# Patient Record
Sex: Male | Born: 1975
Health system: Southern US, Community
[De-identification: ages and names within clinical notes are randomized; demographics above are authoritative.]

## PROBLEM LIST (undated history)

## (undated) HISTORY — PX: TONSILLECTOMY: SUR1361

## (undated) HISTORY — PX: ADENOIDECTOMY: SUR15

---
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
## 2016-04-29 ENCOUNTER — Ambulatory Visit: Payer: Self-pay | Admitting: Psychology

## 2016-06-02 DIAGNOSIS — R0683 Snoring: Secondary | ICD-10-CM | POA: Insufficient documentation

## 2016-06-02 DIAGNOSIS — H9313 Tinnitus, bilateral: Secondary | ICD-10-CM | POA: Insufficient documentation

## 2016-06-02 DIAGNOSIS — Z87891 Personal history of nicotine dependence: Secondary | ICD-10-CM | POA: Insufficient documentation

## 2016-06-02 DIAGNOSIS — Z789 Other specified health status: Secondary | ICD-10-CM | POA: Insufficient documentation

## 2016-06-02 DIAGNOSIS — F418 Other specified anxiety disorders: Secondary | ICD-10-CM | POA: Insufficient documentation

## 2016-06-02 DIAGNOSIS — R030 Elevated blood-pressure reading, without diagnosis of hypertension: Secondary | ICD-10-CM | POA: Insufficient documentation

## 2016-06-02 DIAGNOSIS — Z7289 Other problems related to lifestyle: Secondary | ICD-10-CM | POA: Insufficient documentation

## 2016-06-02 DIAGNOSIS — F1990 Other psychoactive substance use, unspecified, uncomplicated: Secondary | ICD-10-CM | POA: Insufficient documentation

## 2016-06-02 DIAGNOSIS — L74510 Primary focal hyperhidrosis, axilla: Secondary | ICD-10-CM | POA: Insufficient documentation

## 2016-06-02 DIAGNOSIS — N529 Male erectile dysfunction, unspecified: Secondary | ICD-10-CM | POA: Insufficient documentation

## 2016-06-25 DIAGNOSIS — J392 Other diseases of pharynx: Secondary | ICD-10-CM | POA: Insufficient documentation

## 2017-06-01 ENCOUNTER — Ambulatory Visit: Payer: 59 | Admitting: Podiatry

## 2017-06-01 ENCOUNTER — Encounter: Payer: Self-pay | Admitting: Podiatry

## 2017-06-01 ENCOUNTER — Ambulatory Visit (INDEPENDENT_AMBULATORY_CARE_PROVIDER_SITE_OTHER): Payer: 59

## 2017-06-01 VITALS — BP 125/72 | HR 72 | Resp 16

## 2017-06-01 DIAGNOSIS — M722 Plantar fascial fibromatosis: Secondary | ICD-10-CM

## 2017-06-01 DIAGNOSIS — L600 Ingrowing nail: Secondary | ICD-10-CM

## 2017-06-01 MED ORDER — NEOMYCIN-POLYMYXIN-HC 1 % OT SOLN: OTIC | 1 refills | Status: DC

## 2017-06-01 NOTE — Patient Instructions (Signed)
Betadine Soak Instructions  Purchase an 8 oz. bottle of BETADINE solution (Povidone)  THE DAY AFTER THE PROCEDURE  Place 1 tablespoon of betadine solution in a quart of warm tap water.  Submerge your foot or feet with outer bandage intact for the initial soak; this will allow the bandage to become moist and wet for easy lift off.  Once you remove your bandage, continue to soak in the solution for 20 minutes.  This soak should be done twice a day.  Next, remove your foot or feet from solution, blot dry the affected area and cover.  You may use a band aid large enough to cover the area or use gauze and tape.  Apply other medications to the area as directed by the doctor such as cortisporin otic solution (ear drops) or neosporin.  IF YOUR SKIN BECOMES IRRITATED WHILE USING THESE INSTRUCTIONS, IT IS OKAY TO SWITCH TO EPSOM SALTS AND WATER OR WHITE VINEGAR AND WATER.   Long Term Care Instructions-Post Nail Surgery  You have had your ingrown toenail and root treated with a chemical.  This chemical causes a burn that will drain and ooze like a blister.  This can drain for 6-8 weeks or longer.  It is important to keep this area clean, covered, and follow the soaking instructions dispensed at the time of your surgery.  This area will eventually dry and form a scab.  Once the scab forms you no longer need to soak or apply a dressing.  If at any time you experience an increase in pain, redness, swelling, or drainage, you should contact the office as soon as possible.    Plantar Fasciitis (Heel Spur Syndrome) with Rehab The plantar fascia is a fibrous, ligament-like, soft-tissue structure that spans the bottom of the foot. Plantar fasciitis is a condition that causes pain in the foot due to inflammation of the tissue. SYMPTOMS   Pain and tenderness on the underneath side of the foot.  Pain that worsens with standing or walking. CAUSES  Plantar fasciitis is caused by irritation and injury to the plantar  fascia on the underneath side of the foot. Common mechanisms of injury include:  Direct trauma to bottom of the foot.  Damage to a small nerve that runs under the foot where the main fascia attaches to the heel bone.  Stress placed on the plantar fascia due to bone spurs. RISK INCREASES WITH:   Activities that place stress on the plantar fascia (running, jumping, pivoting, or cutting).  Poor strength and flexibility.  Improperly fitted shoes.  Tight calf muscles.  Flat feet.  Failure to warm-up properly before activity.  Obesity. PREVENTION  Warm up and stretch properly before activity.  Allow for adequate recovery between workouts.  Maintain physical fitness:  Strength, flexibility, and endurance.  Cardiovascular fitness.  Maintain a health body weight.  Avoid stress on the plantar fascia.  Wear properly fitted shoes, including arch supports for individuals who have flat feet.  PROGNOSIS  If treated properly, then the symptoms of plantar fasciitis usually resolve without surgery. However, occasionally surgery is necessary.  RELATED COMPLICATIONS   Recurrent symptoms that may result in a chronic condition.  Problems of the lower back that are caused by compensating for the injury, such as limping.  Pain or weakness of the foot during push-off following surgery.  Chronic inflammation, scarring, and partial or complete fascia tear, occurring more often from repeated injections.  TREATMENT  Treatment initially involves the use of ice and medication to   help reduce pain and inflammation. The use of strengthening and stretching exercises may help reduce pain with activity, especially stretches of the Achilles tendon. These exercises may be performed at home or with a therapist. Your caregiver may recommend that you use heel cups of arch supports to help reduce stress on the plantar fascia. Occasionally, corticosteroid injections are given to reduce inflammation. If  symptoms persist for greater than 6 months despite non-surgical (conservative), then surgery may be recommended.   MEDICATION   If pain medication is necessary, then nonsteroidal anti-inflammatory medications, such as aspirin and ibuprofen, or other minor pain relievers, such as acetaminophen, are often recommended.  Do not take pain medication within 7 days before surgery.  Prescription pain relievers may be given if deemed necessary by your caregiver. Use only as directed and only as much as you need.  Corticosteroid injections may be given by your caregiver. These injections should be reserved for the most serious cases, because they may only be given a certain number of times.  HEAT AND COLD  Cold treatment (icing) relieves pain and reduces inflammation. Cold treatment should be applied for 10 to 15 minutes every 2 to 3 hours for inflammation and pain and immediately after any activity that aggravates your symptoms. Use ice packs or massage the area with a piece of ice (ice massage).  Heat treatment may be used prior to performing the stretching and strengthening activities prescribed by your caregiver, physical therapist, or athletic trainer. Use a heat pack or soak the injury in warm water.  SEEK IMMEDIATE MEDICAL CARE IF:  Treatment seems to offer no benefit, or the condition worsens.  Any medications produce adverse side effects.  EXERCISES- RANGE OF MOTION (ROM) AND STRETCHING EXERCISES - Plantar Fasciitis (Heel Spur Syndrome) These exercises may help you when beginning to rehabilitate your injury. Your symptoms may resolve with or without further involvement from your physician, physical therapist or athletic trainer. While completing these exercises, remember:   Restoring tissue flexibility helps normal motion to return to the joints. This allows healthier, less painful movement and activity.  An effective stretch should be held for at least 30 seconds.  A stretch should  never be painful. You should only feel a gentle lengthening or release in the stretched tissue.  RANGE OF MOTION - Toe Extension, Flexion  Sit with your right / left leg crossed over your opposite knee.  Grasp your toes and gently pull them back toward the top of your foot. You should feel a stretch on the bottom of your toes and/or foot.  Hold this stretch for 10 seconds.  Now, gently pull your toes toward the bottom of your foot. You should feel a stretch on the top of your toes and or foot.  Hold this stretch for 10 seconds. Repeat  times. Complete this stretch 3 times per day.   RANGE OF MOTION - Ankle Dorsiflexion, Active Assisted  Remove shoes and sit on a chair that is preferably not on a carpeted surface.  Place right / left foot under knee. Extend your opposite leg for support.  Keeping your heel down, slide your right / left foot back toward the chair until you feel a stretch at your ankle or calf. If you do not feel a stretch, slide your bottom forward to the edge of the chair, while still keeping your heel down.  Hold this stretch for 10 seconds. Repeat 3 times. Complete this stretch 2 times per day.   STRETCH  Gastroc, Standing    Place hands on wall.  Extend right / left leg, keeping the front knee somewhat bent.  Slightly point your toes inward on your back foot.  Keeping your right / left heel on the floor and your knee straight, shift your weight toward the wall, not allowing your back to arch.  You should feel a gentle stretch in the right / left calf. Hold this position for 10 seconds. Repeat 3 times. Complete this stretch 2 times per day.  STRETCH  Soleus, Standing  Place hands on wall.  Extend right / left leg, keeping the other knee somewhat bent.  Slightly point your toes inward on your back foot.  Keep your right / left heel on the floor, bend your back knee, and slightly shift your weight over the back leg so that you feel a gentle stretch deep in  your back calf.  Hold this position for 10 seconds. Repeat 3 times. Complete this stretch 2 times per day.  STRETCH  Gastrocsoleus, Standing  Note: This exercise can place a lot of stress on your foot and ankle. Please complete this exercise only if specifically instructed by your caregiver.   Place the ball of your right / left foot on a step, keeping your other foot firmly on the same step.  Hold on to the wall or a rail for balance.  Slowly lift your other foot, allowing your body weight to press your heel down over the edge of the step.  You should feel a stretch in your right / left calf.  Hold this position for 10 seconds.  Repeat this exercise with a slight bend in your right / left knee. Repeat 3 times. Complete this stretch 2 times per day.   STRENGTHENING EXERCISES - Plantar Fasciitis (Heel Spur Syndrome)  These exercises may help you when beginning to rehabilitate your injury. They may resolve your symptoms with or without further involvement from your physician, physical therapist or athletic trainer. While completing these exercises, remember:   Muscles can gain both the endurance and the strength needed for everyday activities through controlled exercises.  Complete these exercises as instructed by your physician, physical therapist or athletic trainer. Progress the resistance and repetitions only as guided.  STRENGTH - Towel Curls  Sit in a chair positioned on a non-carpeted surface.  Place your foot on a towel, keeping your heel on the floor.  Pull the towel toward your heel by only curling your toes. Keep your heel on the floor. Repeat 3 times. Complete this exercise 2 times per day.  STRENGTH - Ankle Inversion  Secure one end of a rubber exercise band/tubing to a fixed object (table, pole). Loop the other end around your foot just before your toes.  Place your fists between your knees. This will focus your strengthening at your ankle.  Slowly, pull your  big toe up and in, making sure the band/tubing is positioned to resist the entire motion.  Hold this position for 10 seconds.  Have your muscles resist the band/tubing as it slowly pulls your foot back to the starting position. Repeat 3 times. Complete this exercises 2 times per day.  Document Released: 02/23/2005 Document Revised: 05/18/2011 Document Reviewed: 06/07/2008 [employer] Patient Information 2014 [employer].   

## 2017-06-01 NOTE — Progress Notes (Signed)
  Subjective:  Patient ID: Johnny Tran, male    DOB: [birth year],  MRN: [id number] HPI Chief Complaint  Patient presents with  . Foot Pain    Arch bilateral - aching x 4 years, dx wtih PF by podiatrist-wears custom inserts, no AM pain, worse with activity, tried different shoes  . Toe Pain    Hallux bilateral - both nail borders, tender, soaks feet then trims out    42 y.o. male presents with the above complaint.   ROS: He denies fever chills nausea vomiting muscle aches pains calf pain shortness of breath headache.  No past medical history on file.   Current Outpatient Medications:  .  ALPRAZolam (XANAX) 0.5 MG tablet, TAKE 1/2 TO 1 TABLET BY MOUTH EVERY 6 HOURS AS NEEDED FOR SEVERE ANXIETY, Disp: , Rfl: 3 .  escitalopram (LEXAPRO) 20 MG tablet, Take 20 mg by mouth daily., Disp: , Rfl: 3 .  NEOMYCIN-POLYMYXIN-HYDROCORTISONE (CORTISPORIN) 1 % SOLN OTIC solution, Apply 1-2 drops to toe BID after soaking, Disp: 10 mL, Rfl: 1  Allergies  Allergen Reactions  . Codeine Nausea And Vomiting    Other reaction(s): Sweating (intolerance)   Review of Systems Objective:   Vitals:   06/01/17 0903  BP: 125/72  Pulse: 72  Resp: 16    General: Well developed, nourished, in no acute distress, alert and oriented x3   Dermatological: Skin is warm, dry and supple bilateral. Nails x 10 are well maintained; remaining integument appears unremarkable at this time. There are no open sores, no preulcerative lesions, no rash or signs of infection present.  Vascular: Dorsalis Pedis artery and Posterior Tibial artery pedal pulses are 2/4 bilateral with immedate capillary fill time. Pedal hair growth present. No varicosities and no lower extremity edema present bilateral.   Neruologic: Grossly intact via light touch bilateral. Vibratory intact via tuning fork bilateral. Protective threshold with Semmes Wienstein monofilament intact to all pedal sites bilateral. Patellar and Achilles deep tendon  reflexes 2+ bilateral. No Babinski or clonus noted bilateral.   Musculoskeletal: No gross boney pedal deformities bilateral. No pain, crepitus, or limitation noted with foot and ankle range of motion bilateral. Muscular strength 5/5 in all groups tested bilateral.  Gait: Unassisted, Nonantalgic.    Radiographs:  3 views of the bilateral foot taken today demonstrate no acute findings.  He does have some beaking of the talonavicular joint dorsally and a large posterior lateral process of the talus.  But no significant osseous findings other than that.  Assessment & Plan:   Assessment: Ingrown toenails tibiofibular border of the hallux bilateral.  Capsulitis fasciitis  Plan: He was scanned by Johnny Tran today for orthotics.  We also performed a chemical matrixectomy's which were performed after 3 cc of a 50-50 mixture of Marcaine plain and lidocaine plain were injected about the hallux bilaterally.  The area was prepped and draped as normal sterile fashion.  The blood was exsanguinated from each toe and a tourniquet was used.  The nail borders were split from distal to proximal and the nail fragment was avulsed exposing the matrix.  3 applications of phenol 30 seconds each were exposed to the matrix.  They were then neutralized with isopropyl alcohol Silvadene cream Telfa pad and dressed a compressive dressing was applied he is provided with oral and written home-going instructions for the care and soaking of his toe as well as a prescription for Cortisporin Otic to be applied twice daily after soaking.     Johnny Tran T. [place], [place]

## 2017-06-01 NOTE — Progress Notes (Signed)
Scanned and ordered f/o. Patient advised 398 to insurance  a

## 2017-06-17 ENCOUNTER — Ambulatory Visit: Payer: 59 | Admitting: Podiatry

## 2017-06-29 ENCOUNTER — Ambulatory Visit: Payer: 59 | Admitting: Podiatry

## 2017-06-29 DIAGNOSIS — L600 Ingrowing nail: Secondary | ICD-10-CM | POA: Diagnosis not present

## 2017-06-29 MED ORDER — CEPHALEXIN 500 MG PO CAPS: 500.0000 mg | ORAL_CAPSULE | Freq: Three times a day (TID) | ORAL | 0 refills | Status: DC

## 2017-06-30 NOTE — Progress Notes (Signed)
Subjective: 42 year old male presents the office today for follow-up evaluation status post bilateral hallux partial nail avulsions.  He states the right side is doing well on the left side he has noticed a small amount of pus coming from the area he feels is not healing as well.  He has stopped soaking in Epsom salts.  He has not been covering.  He denies any increase in pain after the pain is improved compared to what it was prior to the procedure.  No recent injury.  He also presents today to pick up orthotics. Denies any systemic complaints such as fevers, chills, nausea, vomiting. No acute changes since last appointment, and no other complaints at this time.   Objective: AAO x3, NAD DP/PT pulses palpable bilaterally, CRT less than 3 seconds On the lateral aspect of the left hallux there is a small amount of clear drainage but there is no frank purulence identified.  There is localized edema and mild erythema to the area and there is no ascending sialitis or increase in warmth.  Some of the erythema appears to be well-defined and this is over the area where it.  Her skin has peeled off and there is no extension of the redness past this area.  There is no fluctuation or crepitation or malodor. Right side appears to be healing well No open lesions or pre-ulcerative lesions.  No pain with calf compression, swelling, warmth, erythema  Assessment: Localized erythema left hallux with drainage status post partial nail avulsion  Plan: -All treatment options discussed with the patient including all alternatives, risks, complications.  -I did clean out the area in the lateral aspect left hallux toenail so that any complications.  Only small amount of clear drainage expressed.  I want him to continue with Epsom salt soaks daily as well as antibiotic ointment and a bandage.  Also will start Keflex. -Rick dispensed orthotics today. -Monitor for any clinical signs or symptoms of infection and directed to  call the office immediately should any occur or go to the ER. -Follow-up in 1 week or sooner if needed. -Patient encouraged to call the office with any questions, concerns, change in symptoms.   Trula Slade DPM

## 2017-07-27 ENCOUNTER — Other Ambulatory Visit: Payer: 59 | Admitting: Orthotics

## 2017-09-29 ENCOUNTER — Ambulatory Visit: Payer: 59 | Admitting: Podiatry

## 2017-09-29 ENCOUNTER — Encounter: Payer: Self-pay | Admitting: Podiatry

## 2017-09-29 ENCOUNTER — Ambulatory Visit: Payer: 59 | Admitting: Orthotics

## 2017-09-29 DIAGNOSIS — L600 Ingrowing nail: Secondary | ICD-10-CM | POA: Insufficient documentation

## 2017-09-29 DIAGNOSIS — M722 Plantar fascial fibromatosis: Secondary | ICD-10-CM

## 2017-09-29 MED ORDER — CEPHALEXIN 500 MG PO CAPS: 500.0000 mg | ORAL_CAPSULE | Freq: Three times a day (TID) | ORAL | 0 refills | Status: DC

## 2017-09-29 NOTE — Progress Notes (Signed)
Subjective: 42 year old male presents the office today for concerns of pain and possible ingrown toenail to the right medial toe.  He previously had a partial nail avulsion performed in March of this year.  He states he did get some pus out a couple of days ago.  The area was painful but is not as painful but is still having some swelling in the area.  He said no recent treatment otherwise. Denies any systemic complaints such as fevers, chills, nausea, vomiting. No acute changes since last appointment, and no other complaints at this time.   Objective: AAO x3, NAD DP/PT pulses palpable bilaterally, CRT less than 3 seconds Along the medial aspect of the right hallux toenail appears to be recurrence of ingrown toenail starting to come back along the proximal nail corner there is localized edema to this area.  Small amount of purulence identified from the area today however there is no ascending sialitis or increase in warmth..  No open lesions or pre-ulcerative lesions.  No pain with calf compression, swelling, warmth, erythema  Assessment: Recurrent ingrown toenail right medial hallux  Plan: -All treatment options discussed with the patient including all alternatives, risks, complications.  -At this time, the patient is requesting partial nail removal with chemical matricectomy to the symptomatic portion of the nail. Risks and complications were discussed with the patient for which they understand and written consent was obtained. Under sterile conditions a total of 3 mL of a mixture of 2% lidocaine plain and 0.5% Marcaine plain was infiltrated in a hallux block fashion. Once anesthetized, the skin was prepped in sterile fashion. A tourniquet was then applied. Next the medial aspect of hallux nail border was then sharply excised making sure to remove the entire offending nail border.  Only very minimal amount of purulence was identified today.  After the nail was removed there is no further pus or any  other signs of infection.  There is no significant erythema or ascending cellulitis identified today this appears to be localized.  Once the nails were ensured to be removed area was debrided and the underlying skin was intact. There is no purulence identified in the procedure. Next phenol was then applied under standard conditions and copiously irrigated. Silvadene was applied. A dry sterile dressing was applied. After application of the dressing the tourniquet was removed and there is found to be an immediate capillary refill time to the digit. The patient tolerated the procedure well any complications. Post procedure instructions were discussed the patient for which he verbally understood. Follow-up in 2 weeks for nail check or sooner if any problems are to arise. Discussed signs/symptoms of infection and directed to call the office immediately should any occur or go directly to the emergency room. In the meantime, encouraged to call the office with any questions, concerns, changes symptoms. -Keflex -Patient encouraged to call the office with any questions, concerns, change in symptoms.   Trula Slade DPM

## 2017-09-29 NOTE — Patient Instructions (Signed)

## 2017-10-12 DIAGNOSIS — M25562 Pain in left knee: Secondary | ICD-10-CM | POA: Insufficient documentation

## 2017-10-14 ENCOUNTER — Ambulatory Visit: Payer: Self-pay

## 2017-10-14 ENCOUNTER — Other Ambulatory Visit: Payer: 59 | Admitting: Orthotics

## 2017-10-14 DIAGNOSIS — L600 Ingrowing nail: Secondary | ICD-10-CM

## 2017-10-14 NOTE — Patient Instructions (Signed)

## 2017-10-15 NOTE — Progress Notes (Signed)
Patient presents for follow-up appointment.  Recent procedure done 09/29/2017, ingrown right toenail medial border of hallux nail was removed.  He states that his toe feels fine he is not having any pain.  He did not finish the Keflex as prescribed, he states that he only took it for 2 to 3 days.  He is also not soaking at this time.  Noted well-healing surgical sites.  No erythema, no redness, no drainage, no other signs and symptoms of infection.  Surgical site scabbed over and overall appears to be healing well.  Discussed signs and symptoms of infection and importance of reporting symptoms.  He is to follow-up with any questions or concerns or any changes in his symptoms.

## 2017-10-26 DIAGNOSIS — M2242 Chondromalacia patellae, left knee: Secondary | ICD-10-CM | POA: Insufficient documentation

## 2017-11-01 ENCOUNTER — Encounter

## 2017-11-01 ENCOUNTER — Ambulatory Visit: Payer: 59 | Admitting: Orthotics

## 2017-11-01 DIAGNOSIS — M722 Plantar fascial fibromatosis: Secondary | ICD-10-CM

## 2017-11-01 NOTE — Progress Notes (Signed)
Patient came in today to pick up custom made foot orthotics.  The goals were accomplished and the patient reported no dissatisfaction with said orthotics.  Patient was advised of breakin period and how to report any issues. 

## 2018-02-14 ENCOUNTER — Encounter: Payer: Self-pay | Admitting: Allergy

## 2018-02-14 ENCOUNTER — Ambulatory Visit: Payer: 59 | Admitting: Allergy

## 2018-02-14 VITALS — BP 104/70 | HR 72 | Temp 98.4°F | Resp 18 | Ht 67.5 in | Wt 216.4 lb

## 2018-02-14 DIAGNOSIS — T7809XD Anaphylactic reaction due to other food products, subsequent encounter: Secondary | ICD-10-CM

## 2018-02-14 DIAGNOSIS — E738 Other lactose intolerance: Secondary | ICD-10-CM | POA: Diagnosis not present

## 2018-02-14 DIAGNOSIS — J3089 Other allergic rhinitis: Secondary | ICD-10-CM

## 2018-02-14 DIAGNOSIS — T781XXD Other adverse food reactions, not elsewhere classified, subsequent encounter: Secondary | ICD-10-CM

## 2018-02-14 DIAGNOSIS — T781XXA Other adverse food reactions, not elsewhere classified, initial encounter: Secondary | ICD-10-CM | POA: Insufficient documentation

## 2018-02-14 DIAGNOSIS — E739 Lactose intolerance, unspecified: Secondary | ICD-10-CM

## 2018-02-14 MED ORDER — FLUTICASONE PROPIONATE 50 MCG/ACT NA SUSP: 2.0000 | Freq: Every day | NASAL | 5 refills | Status: AC | PRN

## 2018-02-14 NOTE — Assessment & Plan Note (Signed)
Patient concerned for food allergies particularly wheat as he noticed some headaches after drinking wheat-based beer the next day.  Otherwise he tolerates wheat-based foods and other beer products with no issues.  Today's skin testing was negative to wheat, barley, oat, rye, hops and saccharomyces.   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

## 2018-02-14 NOTE — Assessment & Plan Note (Signed)
Patient gets GI issues after consuming dairy but okay with lactose free products. He most likely has lactose intolerance. May continue to consume lactose free dairy products as before or take lactaid pills prior to consumption of dairy products.

## 2018-02-14 NOTE — Patient Instructions (Addendum)
Today's testing showed: positive to tree pollen which blooms in the spring time, few molds, cat and horse.  Avoid straight dairy products. Okay to eat lactose free products. Avoid wheat based beer.  May use over the counter antihistamines such as Zyrtec (cetirizine), Claritin (loratadine), Allegra (fexofenadine), or Xyzal (levocetirizine) daily as needed.  Start Flonase 2 sprays daily as needed for nasal congestion.  Monitor symptoms.  Follow up in 4 months  Reducing Pollen Exposure . Pollen seasons: trees (spring), grass (summer) and ragweed/weeds (fall). Marland Kitchen Keep windows closed in your home and car to lower pollen exposure.  Susa Simmonds air conditioning in the bedroom and throughout the house if possible.  . Avoid going out in dry windy days - especially early morning. . Pollen counts are highest between 5 - 10 AM and on dry, hot and windy days.  . Save outside activities for late afternoon or after a heavy rain, when pollen levels are lower.  . Avoid mowing of grass if you have grass pollen allergy. Marland Kitchen Be aware that pollen can also be transported indoors on people and pets.  . Dry your clothes in an automatic dryer rather than hanging them outside where they might collect pollen.  . Rinse hair and eyes before bedtime.  Pet Allergen Avoidance: . Contrary to popular opinion, there are no "hypoallergenic" breeds of dogs or cats. That is because people are not allergic to an animal's hair, but to an allergen found in the animal's saliva, dander (dead skin flakes) or urine. Pet allergy symptoms typically occur within minutes. For some people, symptoms can build up and become most severe 8 to 12 hours after contact with the animal. People with severe allergies can experience reactions in public places if dander has been transported on the pet owners' clothing. Marland Kitchen Keeping an animal outdoors is only a partial solution, since homes with pets in the yard still have higher concentrations of animal  allergens. . Before getting a pet, ask your allergist to determine if you are allergic to animals. If your pet is already considered part of your family, try to minimize contact and keep the pet out of the bedroom and other rooms where you spend a great deal of time. . As with dust mites, vacuum carpets often or replace carpet with a hardwood floor, tile or linoleum. . High-efficiency particulate air (HEPA) cleaners can reduce allergen levels over time. . While dander and saliva are the source of cat and dog allergens, urine is the source of allergens from rabbits, hamsters, mice and [place] pigs; so ask a non-allergic family member to clean the animal's cage. . If you have a pet allergy, talk to your allergist about the potential for allergy immunotherapy (allergy shots). This strategy can often provide long-term relief.  Mold Control . Mold and fungi can grow on a variety of surfaces provided certain temperature and moisture conditions exist.  . Outdoor molds grow on plants, decaying vegetation and soil. The major outdoor mold, Alternaria and Cladosporium, are found in very high numbers during hot and dry conditions. Generally, a late summer - fall peak is seen for common outdoor fungal spores. Rain will temporarily lower outdoor mold spore count, but counts rise rapidly when the rainy period ends. . The most important indoor molds are Aspergillus and Penicillium. Dark, humid and poorly ventilated basements are ideal sites for mold growth. The next most common sites of mold growth are the bathroom and the kitchen. Outdoor (Seasonal) Mold Control . Use air conditioning and  keep windows closed. . Avoid exposure to decaying vegetation. Marland Kitchen Avoid leaf raking. . Avoid grain handling. . Consider wearing a face mask if working in moldy areas.  Indoor (Perennial) Mold Control  . Maintain humidity below 50%. . Get rid of mold growth on hard surfaces with water, detergent and, if necessary, 5% bleach (do not  mix with other cleaners). Then dry the area completely. If mold covers an area more than 10 square feet, consider hiring an indoor environmental professional. . For clothing, washing with soap and water is best. If moldy items cannot be cleaned and dried, throw them away. . Remove sources e.g. contaminated carpets. . Repair and seal leaking roofs or pipes. Using dehumidifiers in damp basements may be helpful, but empty the water and clean units regularly to prevent mildew from forming. All rooms, especially basements, bathrooms and kitchens, require ventilation and cleaning to deter mold and mildew growth. Avoid carpeting on concrete or damp floors, and storing items in damp areas.

## 2018-02-14 NOTE — Assessment & Plan Note (Signed)
Rhinitis symptoms for the past 2 to 3 years mainly during the spring.  Used Claritin, Zyrtec and Zaditor as needed with good benefit.  No previous work-up.  Today's skin testing was positive to trees, mold, cat and horse.  Discussed environmental control measures.  May use over the counter antihistamines such as Zyrtec (cetirizine), Claritin (loratadine), Allegra (fexofenadine), or Xyzal (levocetirizine) daily as needed.  Start Flonase 2 sprays daily as needed for nasal congestion during the spring.

## 2018-02-14 NOTE — Progress Notes (Signed)
New Patient Note  RE: Johnny Tran MRN: [id number] DOB: August 15, 1975 Date of Office Visit: 02/14/2018  Referring provider: Milford Cage, PA Primary care provider: Milford Cage, PA  Chief Complaint: Allergy Testing and Nasal Congestion  History of Present Illness: I had the pleasure of seeing Johnny Tran for initial evaluation at the [facility] on 02/14/2018. He is a 42 y.o. male, who is self-referred here for the evaluation of food allergies and nasal congestion.   Food allergies: Patient gets headaches after drinking wheat based beer the next day. No issues with wheat based product or other beer products. Patient is lactose intolerant and takes lactaid pills with good benefit.  Past work up includes: none Dietary History: patient has been eating other foods including limited milk, eggs, peanut, treenuts, sesame, shellfish, seafood, soy, wheat, meats, fruits and vegetables.  He reports reading labels and avoiding straight diary and wheat based beer in diet completely.  Rhinitis: He reports symptoms of sneezing, nasal congestion. Symptoms have been going on for 2-3 years. The symptoms are present spring. Anosmia: no. Headache: yes. He has used Zaditor prn, Claritin, zyrtec with fair improvement in symptoms. Sinus infections: no. Previous work up includes: no. Previous ENT evaluation: yes for hearing loss.   Rash: Patient went camping last year and broke out in a rash in the buttocks area which was diagnosed with poison ivy. Resolved after 2 rounds of steroid injections and pills.   Assessment and Plan: Rhythm is a 42 y.o. male with: Adverse food reaction Patient concerned for food allergies particularly wheat as he noticed some headaches after drinking wheat-based beer the next day.  Otherwise he tolerates wheat-based foods and other beer products with no issues.  Today's skin testing was negative to wheat, barley, oat, rye, hops and saccharomyces.    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  Lactose intolerance Patient gets GI issues after consuming dairy but okay with lactose free products. He most likely has lactose intolerance. May continue to consume lactose free dairy products as before or take lactaid pills prior to consumption of dairy products.   Other allergic rhinitis Rhinitis symptoms for the past 2 to 3 years mainly during the spring.  Used Claritin, Zyrtec and Zaditor as needed with good benefit.  No previous work-up.  Today's skin testing was positive to trees, mold, cat and horse.  Discussed environmental control measures.  May use over the counter antihistamines such as Zyrtec (cetirizine), Claritin (loratadine), Allegra (fexofenadine), or Xyzal (levocetirizine) daily as needed.  Start Flonase 2 sprays daily as needed for nasal congestion during the spring.   Return in about 4 months (around 06/16/2018).  Meds ordered this encounter  Medications  . fluticasone (FLONASE) 50 MCG/ACT nasal spray    Sig: Place 2 sprays into both nostrils daily as needed for allergies or rhinitis.    Dispense:  16 g    Refill:  5   Lab Orders  No laboratory test(s) ordered today    Other allergy screening: Asthma: no Rhino conjunctivitis: yes Medication allergy: yes  Codeine - nausea Hymenoptera allergy: no Urticaria: no Eczema:no History of recurrent infections suggestive of immunodeficency: no  Diagnostics: Skin Testing: enviromental and select foods.. Positive test to: trees, mold, cat, horse. Negative test to: foods.  Results discussed with patient/family. Airborne Adult Perc - 02/14/18 0948    Time Antigen Placed  [phone]  Allergen Manufacturer  [place]    Location  Back    Number of Test  59    Panel 1  Select    1. Control-Buffer 50% Glycerol  Negative    2. Control-Histamine 1 mg/ml  2+    3. Albumin saline  Negative     4. [place]  Negative    5. [place]  Negative    6. [place]  Negative    7. [place] Blue  Negative    8. Meadow Fescue  Negative    9. Perennial Rye  Negative    10. Sweet Vernal  Negative    11. Timothy  Negative    12. Cocklebur  Negative    13. Burweed [place]er  Negative    14. Ragweed, short  Negative    15. Ragweed, Giant  Negative    16. Plantain,  English  Negative    17. Lamb's Quarters  Negative    18. Sheep Sorrell  Negative    19. Rough Pigweed  Negative    20. Marsh Elder, Rough  Negative    21. Mugwort, Common  Negative    22. Ash mix  Negative    23. Birch mix  Negative    24. Beech American  Negative    25. Box, Elder  Negative    26. Cedar, red  Negative    27. Cottonwood, [place]  Negative    28. Elm mix  Negative    29. Hickory mix  Negative    30. Maple mix  4+    31. Oak, [place] mix  Negative    32. Pecan Pollen  4+    33. Pine mix  Negative    34. Sycamore Eastern  Negative    35. [place], Black Pollen  Negative    36. Alternaria alternata  Negative    37. Cladosporium Herbarum  Negative    38. Aspergillus mix  Negative    39. Penicillium mix  Negative    40. Bipolaris sorokiniana (Helminthosporium)  Negative    41. Drechslera spicifera (Curvularia)  Negative    42. Mucor plumbeus  Negative    43. Fusarium moniliforme  Negative    44. Aureobasidium pullulans (pullulara)  Negative    45. Rhizopus oryzae  Negative    46. Botrytis cinera  Negative    47. Epicoccum nigrum  Negative    48. Phoma betae  Negative    49. Candida Albicans  Negative    50. Trichophyton mentagrophytes  Negative    51. Mite, D Farinae  5,000 AU/ml  Negative    52. Mite, D Pteronyssinus  5,000 AU/ml  Negative    53. Cat Hair 10,000 BAU/ml  Negative    54.  Dog Epithelia  Negative    55. Mixed Feathers  Negative    56. Horse Epithelia  3+    57. Cockroach, German  Negative    58. Mouse  Negative    59. Tobacco Leaf  Negative     Intradermal - 02/14/18 1015    Time  Antigen Placed  1024    Allergen Manufacturer  Lavella Hammock    Location  Arm    Number of Test  14    Intradermal  Select    Control  Negative    [place]  Negative    Johnson  Negative    7 Grass  Negative    Ragweed mix  Negative    Weed mix  Negative    Mold 1  Negative    Mold 2  Negative    Mold 3  Negative    Mold 4  3+    Cat  3+    Dog  Negative    Cockroach  Negative    Mite mix  Negative     Food Adult Perc - 02/14/18 0948    Time Antigen Placed  6712    Allergen Manufacturer  Lavella Hammock    Location  Back    Number of allergen test  8     Control-buffer 50% Glycerol  Negative    Control-Histamine 1 mg/ml  2+    3. Wheat  Negative    30. Barley  Negative    31. Oat   Negative    32. Rye   Negative    33. Hops  Negative    36. Saccharomyces Cerevisiae   Negative       Past Medical History: Patient Active Problem List   Diagnosis Date Noted  . Adverse food reaction 02/14/2018  . Other allergic rhinitis 02/14/2018  . Lactose intolerance 02/14/2018  . Ingrown toenail, hallux, bilateral  09/29/2017   History reviewed. No pertinent past medical history. Past Surgical History: Past Surgical History:  Procedure Laterality Date  . ADENOIDECTOMY    . TONSILLECTOMY    . WISDOM TOOTH EXTRACTION Bilateral 1992   Medication List:  Current Outpatient Medications  Medication Sig Dispense Refill  . ALPRAZolam (XANAX) 0.5 MG tablet TAKE 1/2 TO 1 TABLET BY MOUTH EVERY 6 HOURS AS NEEDED FOR SEVERE ANXIETY  3  . escitalopram (LEXAPRO) 20 MG tablet Take 20 mg by mouth daily.  3  . Loperamide-Simethicone 2-125 MG TABS Take 1 tablet by mouth.    . NEOMYCIN-POLYMYXIN-HYDROCORTISONE (CORTISPORIN) 1 % SOLN OTIC solution Apply 1-2 drops to toe BID after soaking 10 mL 1  . Omega-3 Fatty Acids (FISH OIL) 1000 MG CAPS Take 1 capsule by mouth.    . fluticasone (FLONASE) 50 MCG/ACT nasal spray Place 2 sprays into both nostrils daily as needed for allergies or rhinitis. 16 g 5   No current  facility-administered medications for this visit.    Allergies: Allergies  Allergen Reactions  . Codeine Nausea And Vomiting    Other reaction(s): Sweating (intolerance)   Social History: Social History   Socioeconomic History  . Marital status: Unknown    Spouse name: Not on file  . Number of children: Not on file  . Years of education: Not on file  . Highest education level: Not on file  Occupational History  . Not on file  Social Needs  . Financial resource strain: Not on file  . Food insecurity:    Worry: Not on file    Inability: Not on file  . Transportation needs:    Medical: Not on file    Non-medical: Not on file  Tobacco Use  . Smoking status: Former Smoker    Last attempt to quit: 2015    Years since quitting: 4.9  . Smokeless tobacco: Never Used  Substance and Sexual Activity  . Alcohol use: Yes  . Drug use: Yes    Types: Marijuana  . Sexual activity: Not on file  Lifestyle  . Physical activity:    Days per week: Not on file    Minutes per session: Not on file  . Stress: Not on file  Relationships  . Social connections:    Talks on phone: Not on file    Gets together: Not on file  Attends religious service: Not on file    Active member of club or organization: Not on file    Attends meetings of clubs or organizations: Not on file    Relationship status: Not on file  Other Topics Concern  . Not on file  Social History Narrative  . Not on file   Lives in a 44 year old home. Smoking: 1/2pppd x 24 years Occupation: Brewing technologist History: Water Damage/mildew in the house: no Carpet in the family room: no Carpet in the bedroom: no Heating: gas Cooling: central Pet: yes dog x 4 yrs, sugar gliders x 2 yrs  Family History: History reviewed. No pertinent family history. Problem                               Relation Asthma                                   No  Eczema                                No  Food allergy                           No  Allergic rhino conjunctivitis     No   Review of Systems  Constitutional: Negative for appetite change, chills, fever and unexpected weight change.  HENT: Negative for congestion and rhinorrhea.   Eyes: Negative for itching.  Respiratory: Negative for cough, chest tightness, shortness of breath and wheezing.   Cardiovascular: Negative for chest pain.  Gastrointestinal: Negative for abdominal pain.  Genitourinary: Negative for difficulty urinating.  Skin: Negative for rash.  Allergic/Immunologic: Positive for environmental allergies. Negative for food allergies.  Neurological: Negative for headaches.   Objective: BP 104/70 (BP Location: Left Arm, Patient Position: Sitting, Cuff Size: Large)   Pulse 72   Temp 98.4 F (36.9 C) (Oral)   Resp 18   Ht 5' 7.5" (1.715 m)   Wt 216 lb 6.4 oz (98.2 kg)   SpO2 96%   BMI 33.39 kg/m  Body mass index is 33.39 kg/m. Physical Exam  Constitutional: He is oriented to person, place, and time. He appears well-developed and well-nourished.  HENT:  Head: Normocephalic and atraumatic.  Right Ear: External ear normal.  Left Ear: External ear normal.  Nose: Nose normal.  Mouth/Throat: Oropharynx is clear and moist.  Eyes: Conjunctivae and EOM are normal.  Neck: Neck supple.  Cardiovascular: Normal rate, regular rhythm and normal heart sounds. Exam reveals no gallop and no friction rub.  No murmur heard. Pulmonary/Chest: Effort normal and breath sounds normal. He has no wheezes. He has no rales.  Abdominal: Soft. Bowel sounds are normal. There is no tenderness.  Lymphadenopathy:    He has no cervical adenopathy.  Neurological: He is alert and oriented to person, place, and time.  Skin: Skin is warm. No rash noted.  Psychiatric: He has a normal mood and affect. His behavior is normal.  Nursing note and vitals reviewed.  The plan was reviewed with the patient/family, and all questions/concerned were addressed.  It was my pleasure  to see Johnny Tran today and participate in his care. Please feel free to contact me with any questions or concerns.  Sincerely,  Scherrie Bateman  Maudie Mercury, DO Allergy & Immunology  [facility] office: [phone] [place]

## 2018-02-22 NOTE — Progress Notes (Signed)
Adjusted f/o to hiis satisfaction.

## 2019-05-08 ENCOUNTER — Other Ambulatory Visit: Payer: Self-pay

## 2019-05-08 ENCOUNTER — Ambulatory Visit: Payer: 59 | Attending: Internal Medicine

## 2019-05-08 DIAGNOSIS — Z23 Encounter for immunization: Secondary | ICD-10-CM

## 2019-05-08 NOTE — Progress Notes (Signed)
   Covid-19 Vaccination Clinic  Name:  Johnny Tran    MRN: [id number] DOB: Aug 31, 1975  05/08/2019  Mr. [name] was observed post Covid-19 immunization for 15 minutes without incidence. He was provided with Vaccine Information Sheet and instruction to access the V-Safe system.   Mr. [name] was instructed to call 911 with any severe reactions post vaccine: Marland Kitchen Difficulty breathing  . Swelling of your face and throat  . A fast heartbeat  . A bad rash all over your body  . Dizziness and weakness    Immunizations Administered    Name Date Dose VIS Date Route   Pfizer COVID-19 Vaccine 05/08/2019  3:01 PM 0.3 mL 02/17/2019 Intramuscular   Manufacturer: [place]   Lot: [id number]   [place]: [id number]

## 2019-05-31 ENCOUNTER — Ambulatory Visit: Payer: Self-pay | Attending: Internal Medicine

## 2019-05-31 DIAGNOSIS — Z23 Encounter for immunization: Secondary | ICD-10-CM

## 2019-05-31 NOTE — Progress Notes (Signed)
   Covid-19 Vaccination Clinic  Name:  Johnny Tran    MRN: [id number] DOB: [birth year]  05/31/2019  [name] was observed post Covid-19 immunization for 15 minutes without incident. He was provided with Vaccine Information Sheet and instruction to access the V-Safe system.   [name] was instructed to call 911 with any severe reactions post vaccine: Marland Kitchen Difficulty breathing  . Swelling of face and throat  . A fast heartbeat  . A bad rash all over body  . Dizziness and weakness   Immunizations Administered    Name Date Dose VIS Date Route   Pfizer COVID-19 Vaccine 05/31/2019 11:22 AM 0.3 mL 02/17/2019 Intramuscular   Manufacturer: [place]   Lot: [id number]   [place]: [id number]

## 2019-09-18 DIAGNOSIS — H903 Sensorineural hearing loss, bilateral: Secondary | ICD-10-CM | POA: Insufficient documentation

## 2019-10-02 ENCOUNTER — Ambulatory Visit (INDEPENDENT_AMBULATORY_CARE_PROVIDER_SITE_OTHER): Payer: 59

## 2019-10-02 ENCOUNTER — Ambulatory Visit: Payer: 59 | Admitting: Podiatry

## 2019-10-02 ENCOUNTER — Other Ambulatory Visit: Payer: Self-pay

## 2019-10-02 DIAGNOSIS — M205X1 Other deformities of toe(s) (acquired), right foot: Secondary | ICD-10-CM | POA: Diagnosis not present

## 2019-10-02 DIAGNOSIS — M779 Enthesopathy, unspecified: Secondary | ICD-10-CM | POA: Diagnosis not present

## 2019-10-02 DIAGNOSIS — M2061 Acquired deformities of toe(s), unspecified, right foot: Secondary | ICD-10-CM

## 2019-10-08 NOTE — Progress Notes (Signed)
Subjective: 44 year old male presents the office with concerns of pain to the right foot he points on the bunion area.  This is on the last 2 months.  He has no issues on the left side.  He will occasionally get sharp pain in the right foot on this area.  No recent injury or falls.  No other treatment. Denies any systemic complaints such as fevers, chills, nausea, vomiting. No acute changes since last appointment, and no other complaints at this time.   Objective: AAO x3, NAD DP/PT pulses palpable bilaterally, CRT less than 3 seconds Tenderness on the first MPJ on the right foot and there is decreased range of motion on the first MPJ.  There is minimal edema there is no erythema warmth.  There is no other area discomfort.  No pain in the toenails. No pain with calf compression, swelling, warmth, erythema  Assessment: Hallux limitus, capsulitis right foot  Plan: -All treatment options discussed with the patient including all alternatives, risks, complications.  -X-ray today reviewed.  Mild arthritic changes present within the first MPJ.  No evidence of acute fracture. -Discussed steroid injection -Discussed shoe modifications and orthotics.  He is going to follow up with [facility] for orthotics -Patient encouraged to call the office with any questions, concerns, change in symptoms.   Trula Slade DPM

## 2019-10-12 ENCOUNTER — Other Ambulatory Visit: Payer: Self-pay

## 2019-10-12 ENCOUNTER — Ambulatory Visit (INDEPENDENT_AMBULATORY_CARE_PROVIDER_SITE_OTHER): Payer: 59 | Admitting: Orthotics

## 2019-10-12 DIAGNOSIS — M2061 Acquired deformities of toe(s), unspecified, right foot: Secondary | ICD-10-CM

## 2019-10-12 DIAGNOSIS — M205X1 Other deformities of toe(s) (acquired), right foot: Secondary | ICD-10-CM

## 2019-10-12 DIAGNOSIS — M216X2 Other acquired deformities of left foot: Secondary | ICD-10-CM

## 2019-10-12 NOTE — Progress Notes (Signed)
Repeat 2019 order

## 2019-11-01 ENCOUNTER — Telehealth: Payer: Self-pay | Admitting: Podiatry

## 2019-11-01 NOTE — Telephone Encounter (Signed)
Please take care of this.  

## 2019-11-01 NOTE — Telephone Encounter (Signed)
I called pt to tell him orthotics are ok to pick up 2 pr orthotics(1 new/ 1 Refurbished)  He then stated that an anti inflammatory medication was to be called in to the pharmacy( [facility]) and they have not received it.Marland Kitchen

## 2019-11-01 NOTE — Telephone Encounter (Addendum)
This was sent to Dr. Milinda Pointer - I was going to go ahead and send in Rx to correct pharmacy, however, I do not see what Rx needs to be sent.   Refer to Dr. Jacqualyn Posey since this is his patient.

## 2019-11-02 ENCOUNTER — Other Ambulatory Visit: Payer: Self-pay | Admitting: Podiatry

## 2019-11-02 MED ORDER — MELOXICAM 15 MG PO TABS: 15.0000 mg | ORAL_TABLET | Freq: Every day | ORAL | 0 refills | Status: AC

## 2019-11-02 NOTE — Telephone Encounter (Signed)
Sent mobic 

## 2019-12-12 ENCOUNTER — Ambulatory Visit: Payer: Self-pay | Attending: Internal Medicine

## 2019-12-12 DIAGNOSIS — Z23 Encounter for immunization: Secondary | ICD-10-CM

## 2019-12-12 NOTE — Progress Notes (Signed)
   Covid-19 Vaccination Clinic  Name:  Johnny Tran    MRN: [id number] DOB: [birth year]  12/12/2019  Mr. Johnny Tran was observed post Covid-19 immunization for 15 minutes without incident. He was provided with Vaccine Information Sheet and instruction to access the V-Safe system.   Mr. Johnny Tran was instructed to call 911 with any severe reactions post vaccine: Marland Kitchen Difficulty breathing  . Swelling of face and throat  . A fast heartbeat  . A bad rash all over body  . Dizziness and weakness

## 2020-05-20 ENCOUNTER — Ambulatory Visit: Payer: 59 | Admitting: Podiatry

## 2020-05-20 ENCOUNTER — Other Ambulatory Visit: Payer: Self-pay

## 2020-05-20 DIAGNOSIS — M205X1 Other deformities of toe(s) (acquired), right foot: Secondary | ICD-10-CM

## 2020-05-20 DIAGNOSIS — M779 Enthesopathy, unspecified: Secondary | ICD-10-CM

## 2020-05-20 DIAGNOSIS — M2061 Acquired deformities of toe(s), unspecified, right foot: Secondary | ICD-10-CM

## 2020-05-20 MED ORDER — MELOXICAM 15 MG PO TABS: 15.0000 mg | ORAL_TABLET | Freq: Every day | ORAL | 0 refills | Status: DC

## 2020-05-20 MED ORDER — TRIAMCINOLONE ACETONIDE 10 MG/ML IJ SUSP
10.0000 mg | Freq: Once | INTRAMUSCULAR | Status: AC
  Administered 2020-05-22: 10 mg

## 2020-05-20 NOTE — Patient Instructions (Signed)
We were talking about what is called a "1st MTPJ arthrodesis" I typically use hardware from a company called [employer] where we put a plate and screws/staple across the joint in order to fuse the joint.

## 2020-05-22 DIAGNOSIS — M779 Enthesopathy, unspecified: Secondary | ICD-10-CM | POA: Diagnosis not present

## 2020-05-22 NOTE — Progress Notes (Signed)
Subjective: 45 year old male presents the office today for injection in his right foot on his big toe.  He states the pain started to hurt again he is going to [employer] and want to have an injection.  He also wants to discuss other options including surgery.  No recent or falls or changes otherwise. Denies any systemic complaints such as fevers, chills, nausea, vomiting. No acute changes since last appointment, and no other complaints at this time.   Objective: AAO x3, NAD DP/PT pulses palpable bilaterally, CRT less than 3 seconds There is tenderness in the dorsal aspect of the right first MPJ there is mild discomfort pressure measured with mild crepitation.  There is no erythema or warmth.  No other areas of discomfort identified.  No pain with calf compression, swelling, warmth, erythema  Assessment: Right foot capsulitis, hallux limitus  Plan: -All treatment options discussed with the patient including all alternatives, risks, complications.  -Steroid injection was performed.  See procedure note below. -We also surgical invention including first MPJ arthrodesis. -We will try to get him a graphite insert prior to his trip.  -Patient encouraged to call the office with any questions, concerns, change in symptoms.   Procedure: Injection small joint Discussed alternatives, risks, complications and verbal consent was obtained.  Location: Right 1st MTPJ Skin Prep: Betadine. Injectate: 0.5cc 0.5% marcaine plain, 0.5 cc 2% lidocaine plain and, 1 cc kenalog 10. Disposition: Patient tolerated procedure well. Injection site dressed with a band-aid.  Post-injection care was discussed and return precautions discussed.   Return if symptoms worsen or fail to improve.  Trula Slade DPM

## 2020-05-23 ENCOUNTER — Telehealth: Payer: Self-pay | Admitting: *Deleted

## 2020-05-23 NOTE — Telephone Encounter (Signed)
Called and left a message for the patient that I mailed a carbon insert per Dr Jacqualyn Posey. Johnny Tran

## 2020-06-10 ENCOUNTER — Other Ambulatory Visit: Payer: Self-pay | Admitting: Podiatry

## 2020-06-10 NOTE — Telephone Encounter (Signed)
Please advise 

## 2020-07-08 ENCOUNTER — Telehealth: Payer: Self-pay | Admitting: Podiatry

## 2020-07-08 NOTE — Telephone Encounter (Signed)
Pt left message asking about warranty for the orthotics. He stated they are coming apart. He got them refurbished last august(2021).   I talked with [facility] @ [place] and she said it was a little over but to send them back and they would take care of it for [place]..  I called pt and informed him of this and he said he will probably drop them off tomorrow. I told him to tell the person he gives them to that they are under warranty so no charge.

## 2020-07-08 NOTE — Telephone Encounter (Signed)
Thanks

## 2020-07-16 ENCOUNTER — Other Ambulatory Visit: Payer: Self-pay | Admitting: Podiatry

## 2020-07-16 NOTE — Telephone Encounter (Signed)
Please advise 

## 2020-07-17 ENCOUNTER — Telehealth: Payer: Self-pay | Admitting: Podiatry

## 2020-07-17 NOTE — Telephone Encounter (Signed)
Received message from lab about pts orthotics that were sent back under warranty. They were wanting to know what top cover we wanted.  I contacted pt and asked him if we could change the top cover because I believe that when they start to separate like his it is caused by excess sweat. He said it was fine to change the top cover as long as it was not a thick top cover because that takes up too much room in shoes and is not comfortable. He stated his feet do sweat alot  I then called [facility] @ [place] and told her ok to change to something that would with hold sweat better and not separate but it cannot be very thick. She is going to talk with Cristie Hem and they will decide what is going to work best for pt.

## 2020-07-22 ENCOUNTER — Telehealth: Payer: Self-pay | Admitting: Podiatry

## 2020-07-22 NOTE — Telephone Encounter (Signed)
Orthotics in (Designer, television/film set @ n/c) ... lvm for pt ok to pick them up.Marland Kitchen

## 2020-08-14 ENCOUNTER — Other Ambulatory Visit: Payer: Self-pay | Admitting: Podiatry

## 2020-08-14 NOTE — Telephone Encounter (Signed)
Please advise 

## 2020-09-10 ENCOUNTER — Ambulatory Visit (INDEPENDENT_AMBULATORY_CARE_PROVIDER_SITE_OTHER): Payer: 59

## 2020-09-10 ENCOUNTER — Encounter: Payer: Self-pay | Admitting: Podiatry

## 2020-09-10 ENCOUNTER — Ambulatory Visit: Payer: 59 | Admitting: Podiatry

## 2020-09-10 ENCOUNTER — Other Ambulatory Visit: Payer: Self-pay

## 2020-09-10 DIAGNOSIS — M205X1 Other deformities of toe(s) (acquired), right foot: Secondary | ICD-10-CM

## 2020-09-10 DIAGNOSIS — M7751 Other enthesopathy of right foot: Secondary | ICD-10-CM | POA: Diagnosis not present

## 2020-09-10 DIAGNOSIS — G4700 Insomnia, unspecified: Secondary | ICD-10-CM | POA: Insufficient documentation

## 2020-09-10 DIAGNOSIS — G4733 Obstructive sleep apnea (adult) (pediatric): Secondary | ICD-10-CM | POA: Insufficient documentation

## 2020-09-10 DIAGNOSIS — Z9989 Dependence on other enabling machines and devices: Secondary | ICD-10-CM | POA: Insufficient documentation

## 2020-09-10 NOTE — Patient Instructions (Signed)
The company that I will use the hardware for most likely is called "[employer]" and it is a MTPJ fusion.

## 2020-09-16 ENCOUNTER — Other Ambulatory Visit: Payer: Self-pay | Admitting: Podiatry

## 2020-09-16 NOTE — Progress Notes (Signed)
Subjective: 45 year old male presents the office today for injection in his right foot on his big toe.  Also discussed surgical options at the pain has been increasing.  He is electing surgery later in the year.  Denies any recent injury or changes otherwise. Denies any systemic complaints such as fevers, chills, nausea, vomiting. No acute changes since last appointment, and no other complaints at this time.   Objective: AAO x3, NAD DP/PT pulses palpable bilaterally, CRT less than 3 seconds There is tenderness in the dorsal aspect of the right first MPJ there is mild discomfort pressure measured with mild crepitation.  There is no erythema or warmth.  No other areas of discomfort identified.  No pain with calf compression, swelling, warmth, erythema  Assessment: Right foot capsulitis, hallux limitus  Plan: -All treatment options discussed with the patient including all alternatives, risks, complications.  -X-rays obtained reviewed.  Mild arthritic changes present first MPJ. -Steroid injection was performed.  See procedure note below. -We also surgical invention including first MPJ arthrodesis versus cheilectomy.  Discussed the surgery plus postoperative course.  He will consider his options. -Patient encouraged to call the office with any questions, concerns, change in symptoms.   Procedure: Injection small joint Discussed alternatives, risks, complications and verbal consent was obtained.  Location: Right 1st MTPJ Skin Prep: Betadine. Injectate: 0.5cc 0.5% marcaine plain, 0.5 cc 2% lidocaine plain and, 1 cc kenalog 10. Disposition: Patient tolerated procedure well. Injection site dressed with a band-aid.  Post-injection care was discussed and return precautions discussed.   Return if symptoms worsen or fail to improve.  Trula Slade DPM

## 2020-09-23 ENCOUNTER — Ambulatory Visit: Payer: 59 | Admitting: Podiatry

## 2020-10-28 DIAGNOSIS — T161XXA Foreign body in right ear, initial encounter: Secondary | ICD-10-CM | POA: Insufficient documentation

## 2020-10-30 ENCOUNTER — Other Ambulatory Visit: Payer: Self-pay | Admitting: Internal Medicine

## 2020-10-30 DIAGNOSIS — Z122 Encounter for screening for malignant neoplasm of respiratory organs: Secondary | ICD-10-CM

## 2020-11-25 ENCOUNTER — Other Ambulatory Visit: Payer: Self-pay

## 2020-11-25 ENCOUNTER — Ambulatory Visit
Admission: RE | Admit: 2020-11-25 | Discharge: 2020-11-25 | Disposition: A | Discharge status: Home / Self Care | Payer: 59 | Source: Ambulatory Visit | Attending: Internal Medicine | Admitting: Internal Medicine

## 2020-11-25 DIAGNOSIS — Z122 Encounter for screening for malignant neoplasm of respiratory organs: Secondary | ICD-10-CM

## 2020-11-29 ENCOUNTER — Encounter: Payer: Self-pay | Admitting: Podiatrist

## 2020-11-29 ENCOUNTER — Ambulatory Visit (INDEPENDENT_AMBULATORY_CARE_PROVIDER_SITE_OTHER): Payer: 59

## 2020-11-29 ENCOUNTER — Other Ambulatory Visit: Payer: Self-pay

## 2020-11-29 ENCOUNTER — Ambulatory Visit: Payer: 59 | Admitting: Podiatrist

## 2020-11-29 DIAGNOSIS — M7751 Other enthesopathy of right foot: Secondary | ICD-10-CM | POA: Diagnosis not present

## 2020-11-29 MED ORDER — DEXAMETHASONE SODIUM PHOSPHATE 120 MG/30ML IJ SOLN
4.0000 mg | Freq: Once | INTRAMUSCULAR | Status: AC
  Administered 2020-11-29: 4 mg via INTRA_ARTICULAR

## 2020-11-29 NOTE — Progress Notes (Signed)
Chief Complaint  Patient presents with   Foot Pain    Right foot pain  Pain is localized under the first 3 toes at the ball of the foot  Pt stated that it feels like a bubble is in his foot      HPI: Patient is 45 y.o. male who presents today for pain in the right foot localized under the toes 2 3 and 4 of the right foot.  He states that it feels inflamed with the first up in the morning and throughout the day it gets even more inflamed.  He states it feels like there is a fullness present or like there is a bubble on the bottom of his foot.  He denies any trauma or injury to the foot.  He knows he has arthritis on the first metatarsophalangeal joint of the same foot and is considering surgery with Dr. Jacqualyn Posey in the future.   Allergies  Allergen Reactions   Codeine Nausea And Vomiting    Other reaction(s): Sweating (intolerance)    Review of systems is reviewed and negative.   Physical Exam  Patient is awake, alert, and oriented x 3.  In no acute distress.    Vascular status is intact with palpable pedal pulses DP and PT bilateral and capillary refill time less than 3 seconds bilateral.  No edema or erythema noted.   Neurological exam reveals epicritic and protective sensation grossly intact bilateral.   Dermatological exam reveals skin is supple and dry to bilateral feet.  No open lesions present.    Musculoskeletal exam: Hallux rigidus/limitus is present on the right first metatarsal phalangeal joint with decrease in range of motion noted and inflammation around the first metatarsal phalangeal joint seen. Pain on palpation plantar aspect submetatarsal 2 and 3 of the right foot is identified.  No palpable click noted concerning for neuroma.  X-rays 3 views of the right foot are obtained.  No sign of acute osseous abnormalities are noted no sign of stress fracture or stress reaction noted.  Spurring at the first metatarsal phalangeal joint with decrease in joint space and flattening  of the first metatarsal head is noted on x-ray which is unchanged from the previous films.  Assessment: 1. Capsulitis of toe, right      Plan: Discussed treatment options and alternatives with the patient.    Discussed we could try a steroid injection as a therapeutic and diagnostic approach.  The patient would like to try this as well as an injection into the first metatarsal phalangeal joint as his pain here is also becoming worse.    I did agree I prepped the skin with alcohol and infiltrated dexamethasone and Marcaine mixture into the second/third metatarsal interspace and plantarly without complication.   A second injection performed under sterile techinque into the first mpj as well.    The patient will follow up with Dr. Jacqualyn Posey his already scheduled appointment discussed he should also ice and take anti-inflammatories as he may have some discomfort after the injection.  If any concerns arise he will call otherwise he will be seen back for follow-up.

## 2020-12-23 ENCOUNTER — Encounter: Payer: Self-pay | Admitting: Podiatry

## 2020-12-23 ENCOUNTER — Ambulatory Visit: Payer: 59 | Admitting: Podiatry

## 2020-12-23 ENCOUNTER — Other Ambulatory Visit: Payer: Self-pay

## 2020-12-23 DIAGNOSIS — M205X1 Other deformities of toe(s) (acquired), right foot: Secondary | ICD-10-CM

## 2020-12-23 DIAGNOSIS — M7751 Other enthesopathy of right foot: Secondary | ICD-10-CM | POA: Diagnosis not present

## 2020-12-23 NOTE — Patient Instructions (Signed)

## 2020-12-24 ENCOUNTER — Telehealth: Payer: Self-pay

## 2020-12-24 NOTE — Progress Notes (Signed)
Subjective: 45 year old male presents the office today for injection in his right foot on his big toe.  States that he has continued discomfort and he feels a crunching sensation in his big toe when he tries to flex it.  He states he started compensating in the other areas of the foot and the ball.  He wants to consider surgery at this point.  No recent injury or changes otherwise.  No fevers or chills.  No other concerns.   Objective: AAO x3, NAD DP/PT pulses palpable bilaterally, CRT less than 3 seconds There is tenderness in the dorsal aspect of the right first MPJ and there is crepitation on range of motion of the first MPJ.  Slight edema there is no erythema or warmth.  No other areas of pinpoint tenderness identified today.  MMT 5/5.  Assessment: Right foot capsulitis, hallux limitus  Plan: -All treatment options discussed with the patient including all alternatives, risks, complications.  -Reviewed his prior x-rays.  Arthritic changes present the first MPJ and clinically crepitus with range of motion.  I discussed with him first MPJ arthrodesis.  He is attempted numerous conservative treatment without significant improvement elects proceed with surgical intervention.  We will proceed with a first MPJ arthrodesis. -The incision placement as well as the postoperative course was discussed with the patient. I discussed risks of the surgery which include, but not limited to, infection, bleeding, pain, swelling, need for further surgery, delayed or nonhealing, painful or ugly scar, numbness or sensation changes, over/under correction, recurrence, transfer lesions, further deformity, hardware failure, DVT/PE, loss of toe/foot. Patient understands these risks and wishes to proceed with surgery. The surgical consent was reviewed with the patient all 3 pages were signed. No promises or guarantees were given to the outcome of the procedure. All questions were answered to the best of my ability. Before the  surgery the patient was encouraged to call the office if there is any further questions. The surgery will be performed at the [facility] on an outpatient basis. -Knee scooter ordered for postoperative use.  Trula Slade DPM

## 2020-12-24 NOTE — Telephone Encounter (Signed)
Order for knee scooter placed with [facility] for surgery on 01/15/2021

## 2020-12-27 ENCOUNTER — Telehealth: Payer: Self-pay | Admitting: Urology

## 2020-12-27 NOTE — Telephone Encounter (Signed)
DOS - 01/15/21  HALLUX MPJ FUSION RIGHT --- 27639  [employer] EFFECTIVE DATE - 03/09/20  PLAN DEDUCTIBLE -  $300.00 W/ $0.00 REMAINING  OUT OF POCKET - $2,000.00 W/ $0.00 REMAINING COINSURANCE - 10% COPAY - $0.00   PER [employer] WEB SITE FOR CPT CODE 43200 Notification or Prior Authorization is not required for the requested services   Decision ID #:[id number]

## 2021-01-14 ENCOUNTER — Encounter: Payer: Self-pay | Admitting: Podiatry

## 2021-01-15 ENCOUNTER — Encounter: Payer: Self-pay | Admitting: Podiatry

## 2021-01-15 ENCOUNTER — Other Ambulatory Visit: Payer: Self-pay | Admitting: Podiatry

## 2021-01-15 DIAGNOSIS — M2021 Hallux rigidus, right foot: Secondary | ICD-10-CM | POA: Diagnosis not present

## 2021-01-15 MED ORDER — CEPHALEXIN 500 MG PO CAPS: 500.0000 mg | ORAL_CAPSULE | Freq: Four times a day (QID) | ORAL | 0 refills | Status: DC

## 2021-01-15 MED ORDER — PROMETHAZINE HCL 25 MG PO TABS: 25.0000 mg | ORAL_TABLET | Freq: Three times a day (TID) | ORAL | 0 refills | Status: DC | PRN

## 2021-01-15 MED ORDER — OXYCODONE-ACETAMINOPHEN 5-325 MG PO TABS: 1.0000 | ORAL_TABLET | Freq: Four times a day (QID) | ORAL | 0 refills | Status: DC | PRN

## 2021-01-15 NOTE — Progress Notes (Signed)
Post-op medications sent Discussed medications with him pre-op

## 2021-01-16 ENCOUNTER — Telehealth: Payer: Self-pay | Admitting: *Deleted

## 2021-01-16 NOTE — Telephone Encounter (Signed)
Patient is calling because he wants to know if he can use a knee scooter instead of crutches, has already fallen twice with them,not good on them. Please advise.

## 2021-01-17 ENCOUNTER — Other Ambulatory Visit: Payer: Self-pay | Admitting: Podiatry

## 2021-01-17 MED ORDER — OXYCODONE-ACETAMINOPHEN 10-325 MG PO TABS: 1.0000 | ORAL_TABLET | ORAL | 0 refills | Status: DC | PRN

## 2021-01-20 ENCOUNTER — Ambulatory Visit (INDEPENDENT_AMBULATORY_CARE_PROVIDER_SITE_OTHER): Payer: 59

## 2021-01-20 ENCOUNTER — Ambulatory Visit (INDEPENDENT_AMBULATORY_CARE_PROVIDER_SITE_OTHER): Payer: 59 | Admitting: Podiatry

## 2021-01-20 ENCOUNTER — Other Ambulatory Visit: Payer: Self-pay

## 2021-01-20 DIAGNOSIS — M205X1 Other deformities of toe(s) (acquired), right foot: Secondary | ICD-10-CM | POA: Diagnosis not present

## 2021-01-20 DIAGNOSIS — Z9889 Other specified postprocedural states: Secondary | ICD-10-CM

## 2021-01-21 NOTE — Telephone Encounter (Signed)
Patient has been called and the knee scooter is working better for him.

## 2021-01-22 NOTE — Progress Notes (Signed)
Subjective: Johnny Tran is a 45 y.o. is seen today in office s/p right foot first MPJ arthrodesis preformed on 01/15/2021.  States this pain is getting better.  Still taking pain medication.  Has been nonweightbearing.  He had a couple stumbles, falls but no injuries otherwise.  Denies any systemic complaints such as fevers, chills, nausea, vomiting. No calf pain, chest pain, shortness of breath.   Objective: General: No acute distress, AAOx3  DP/PT pulses palpable 2/4, CRT < 3 sec to all digits.  Protective sensation intact. Motor function intact.  Right foot: Incision is well coapted without any evidence of dehiscence with sutures intact. There is no surrounding erythema, ascending cellulitis, fluctuance, crepitus, malodor, drainage/purulence. There is mild edema around the surgical site. There is mild pain along the surgical site.  Arthrodesis site appears to be stable. No other areas of tenderness to bilateral lower extremities.  No other open lesions or pre-ulcerative lesions.  No pain with calf compression, swelling, warmth, erythema.   Assessment and Plan:  Status post right first MPJ arthrodesis, doing well with no complications   -Treatment options discussed including all alternatives, risks, and complications -X-rays obtained reviewed.  No evidence of acute fracture.  Hardware intact.  There is mild gapping present with first MPJ arthrodesis site. -Antibiotic ointment was applied followed by dressing.  Keep dressing clean, dry, intact -Continue nonweightbearing.  He is asking for forearm crutches.  Will order. -Ice/elevation -Pain medication as needed. -Monitor for any clinical signs or symptoms of infection and DVT/PE and directed to call the office immediately should any occur or go to the ER. -Follow-up as scheduled or sooner if any problems arise. In the meantime, encouraged to call the office with any questions, concerns, change in symptoms.   Celesta Gentile, DPM

## 2021-01-28 ENCOUNTER — Ambulatory Visit (INDEPENDENT_AMBULATORY_CARE_PROVIDER_SITE_OTHER): Payer: 59 | Admitting: Podiatry

## 2021-01-28 ENCOUNTER — Encounter: Payer: Self-pay | Admitting: Podiatry

## 2021-01-28 ENCOUNTER — Other Ambulatory Visit: Payer: Self-pay

## 2021-01-28 DIAGNOSIS — E781 Pure hyperglyceridemia: Secondary | ICD-10-CM | POA: Insufficient documentation

## 2021-01-28 DIAGNOSIS — K589 Irritable bowel syndrome without diarrhea: Secondary | ICD-10-CM | POA: Insufficient documentation

## 2021-01-28 DIAGNOSIS — D126 Benign neoplasm of colon, unspecified: Secondary | ICD-10-CM | POA: Insufficient documentation

## 2021-01-28 DIAGNOSIS — R399 Unspecified symptoms and signs involving the genitourinary system: Secondary | ICD-10-CM | POA: Insufficient documentation

## 2021-01-28 DIAGNOSIS — Z1211 Encounter for screening for malignant neoplasm of colon: Secondary | ICD-10-CM | POA: Insufficient documentation

## 2021-01-28 DIAGNOSIS — F909 Attention-deficit hyperactivity disorder, unspecified type: Secondary | ICD-10-CM | POA: Insufficient documentation

## 2021-01-28 DIAGNOSIS — Z9889 Other specified postprocedural states: Secondary | ICD-10-CM

## 2021-01-28 DIAGNOSIS — Z800 Family history of malignant neoplasm of digestive organs: Secondary | ICD-10-CM | POA: Insufficient documentation

## 2021-01-28 DIAGNOSIS — M205X1 Other deformities of toe(s) (acquired), right foot: Secondary | ICD-10-CM

## 2021-02-04 NOTE — Progress Notes (Signed)
Subjective: Johnny Tran is a 45 y.o. is seen today in office s/p right foot first MPJ arthrodesis preformed on 01/15/2021.  States he is getting better as well as the pain.  Still been nonweightbearing in the cam boot.  No recent injury or fall since I last saw him.  No new concerns today.  No fevers or chills.   Objective: General: No acute distress, AAOx3  DP/PT pulses palpable 2/4, CRT < 3 sec to all digits.  Protective sensation intact. Motor function intact.  Right foot: Incision is well coapted without any evidence of dehiscence with sutures intact.  There is decreased edema.  There is no erythema, drainage or pus or any signs of infection.  Arthrodesis site appears to be stable.  No other areas of discomfort identified. No other open lesions or pre-ulcerative lesions.  No pain with calf compression, swelling, warmth, erythema.   Assessment and Plan:  Status post right first MPJ arthrodesis, doing well with no complications   -Treatment options discussed including all alternatives, risks, and complications -At this time and start to wash the foot with soap and water and dry thoroughly and apply a small amount of antibiotic ointment and a bandage.  This was also applied today.  For now continue nonweightbearing, ice elevation in the cam boot.  Pain medication as needed.  Patient several questions today which I answered and he had no further questions. -Monitor for any clinical signs or symptoms of infection and DVT/PE and directed to call the office immediately should any occur or go to the ER. -Follow-up as scheduled or sooner if any problems arise. In the meantime, encouraged to call the office with any questions, concerns, change in symptoms.   Celesta Gentile, DPM

## 2021-02-06 ENCOUNTER — Other Ambulatory Visit: Payer: Self-pay | Admitting: Podiatry

## 2021-02-11 ENCOUNTER — Encounter: Payer: Self-pay | Admitting: Podiatry

## 2021-02-11 ENCOUNTER — Ambulatory Visit (INDEPENDENT_AMBULATORY_CARE_PROVIDER_SITE_OTHER): Payer: 59 | Admitting: Podiatry

## 2021-02-11 ENCOUNTER — Other Ambulatory Visit: Payer: Self-pay

## 2021-02-11 ENCOUNTER — Ambulatory Visit (INDEPENDENT_AMBULATORY_CARE_PROVIDER_SITE_OTHER): Payer: 59

## 2021-02-11 DIAGNOSIS — M205X1 Other deformities of toe(s) (acquired), right foot: Secondary | ICD-10-CM | POA: Diagnosis not present

## 2021-02-11 DIAGNOSIS — Z9889 Other specified postprocedural states: Secondary | ICD-10-CM

## 2021-02-11 NOTE — Progress Notes (Signed)
Subjective: Johnny Tran is a 45 y.o. is seen today in office s/p right foot first MPJ arthrodesis preformed on 01/15/2021.  States he is feeling "fine".  He has been in the cam boot and staying off of the foot.  No recent injury or falls otherwise since I last saw him.  He has no other concerns today   Objective: General: No acute distress, AAOx3  DP/PT pulses palpable 2/4, CRT < 3 sec to all digits.  Protective sensation intact. Motor function intact.  Right foot: Incision is well coapted without any evidence of dehiscence with sutures intact.  There is no surrounding erythema, drainage or pus or ascending cellulitis.  Mild edema at surgical site.  Arthrodesis site appears to be stable.  No significant tenderness to palpation along the surgical site.  No other areas of discomfort. No other open lesions or pre-ulcerative lesions.  No pain with calf compression, swelling, warmth, erythema.   Assessment and Plan:  Status post right first MPJ arthrodesis, doing well with no complications   -Treatment options discussed including all alternatives, risks, and complications -X-rays obtained reviewed.  Hardware intact.  Mild gapping present along the arthrodesis site but otherwise hardware intact. -At this point arthrodesis site is stable and not in significant pain.  At this point discussed with him continuing the cam boot and he can start to transition to partial weightbearing in the boot.  Continue ice and elevate as well. -We will plan on returning to work on February 15, 2021.  He must sit try to elevate the foot is much as possible.  No lifting and he needs to use the knee scooter at work.  This needs to occur for the next 1 month.  Return in about 2 weeks (around 02/25/2021) for post-op visit, x-ray.  Trula Slade DPM

## 2021-02-15 ENCOUNTER — Encounter: Payer: Self-pay | Admitting: Podiatry

## 2021-02-16 NOTE — Telephone Encounter (Signed)
Please advise 

## 2021-02-24 ENCOUNTER — Other Ambulatory Visit: Payer: Self-pay

## 2021-02-24 ENCOUNTER — Ambulatory Visit (INDEPENDENT_AMBULATORY_CARE_PROVIDER_SITE_OTHER): Payer: 59 | Admitting: Podiatry

## 2021-02-24 ENCOUNTER — Ambulatory Visit (INDEPENDENT_AMBULATORY_CARE_PROVIDER_SITE_OTHER): Payer: 59

## 2021-02-24 DIAGNOSIS — M205X1 Other deformities of toe(s) (acquired), right foot: Secondary | ICD-10-CM | POA: Diagnosis not present

## 2021-02-24 DIAGNOSIS — Z9889 Other specified postprocedural states: Secondary | ICD-10-CM

## 2021-02-26 NOTE — Progress Notes (Signed)
Subjective: Johnny Tran is a 45 y.o. is seen today in office s/p right foot first MPJ arthrodesis preformed on 01/15/2021.  States that he has been doing better.  He had a couple of falls recently but states pain is improved.  Is actually been putting some weight on the foot for short amount of time.    Objective: General: No acute distress, AAOx3  DP/PT pulses palpable 2/4, CRT < 3 sec to all digits.  Protective sensation intact. Motor function intact.  Right foot: Incision is well coapted without any evidence of dehiscence except on the distal portion the incision there is macerated tissue superficial area skin breakdown there is no probing.  No surrounding erythema, ascending cellulitis.  No fluctuation crepitation.  No malodor.  Arthrodesis site appears to be stable.  Toes in rectus position.  No significant discomfort to palpation at surgical site or other areas of the foot.  No other open lesions or pre-ulcerative lesions.  No pain with calf compression, swelling, warmth, erythema.   Assessment and Plan:  Status post right first MPJ arthrodesis  -Treatment options discussed including all alternatives, risks, and complications -X-rays obtained reviewed.  Hardware intact.  Mild gapping present along the arthrodesis site but otherwise hardware intact.  Overall x-ray is unchanged. -At this point clinically he appears to be improving from a pain and swelling standpoint.  Discussed he can start to transition to partial weightbearing as tolerated.  Continue to ice and elevate as well. -Small amount of antibiotic ointment during the day and a bandage along the distal portion incision but leave it open at nighttime or dry bandage to allow them to heal.  Monitoring signs or symptoms of infection. -Monitor for any clinical signs or symptoms of infection and directed to call the office immediately should any occur or go to the ER.  Return in about 2 weeks (around 03/10/2021).  Trula Slade  DPM

## 2021-03-17 ENCOUNTER — Ambulatory Visit (INDEPENDENT_AMBULATORY_CARE_PROVIDER_SITE_OTHER): Payer: 59

## 2021-03-17 ENCOUNTER — Ambulatory Visit (INDEPENDENT_AMBULATORY_CARE_PROVIDER_SITE_OTHER): Payer: 59 | Admitting: Podiatry

## 2021-03-17 ENCOUNTER — Other Ambulatory Visit: Payer: Self-pay

## 2021-03-17 DIAGNOSIS — Z9889 Other specified postprocedural states: Secondary | ICD-10-CM

## 2021-03-19 NOTE — Progress Notes (Signed)
Subjective: Johnny Tran is a 46 y.o. is seen today in office s/p right foot first MPJ arthrodesis preformed on 01/15/2021. States that he has continues to improve.  He has been walking barefoot or wear shoes at home but wearing the cam boot at work.  No recent injury.  Decreased with fine.  No fevers or chills.  No other complaints.  Objective: General: No acute distress, AAOx3  DP/PT pulses palpable 2/4, CRT < 3 sec to all digits.  Protective sensation intact. Motor function intact.  Right foot: Incision is well coapted without any evidence of dehiscence and scar is formed.  There is mild to minimal edema.  There is no erythema warmth or any signs of infection.  There is no open sore identified.  No significant tenderness palpation.  Arthrodesis site is stable. No other open lesions or pre-ulcerative lesions.  No pain with calf compression, swelling, warmth, erythema.   Assessment and Plan:  Status post right first MPJ arthrodesis  -Treatment options discussed including all alternatives, risks, and complications -X-rays obtained reviewed.  Hardware intact.  Mild gapping present along the arthrodesis site but otherwise hardware intact.   -Patient was continued for this part of swelling as well for discomfort.  Discussed we can start to transition to regular shoe full-time as tolerated.  However discussed if there is any increase or continue swelling or discomfort to let me know.  If x-rays continue to show gapping and consider bone spur there next appointment.  Discussed shoes with a stiffer sole.  Continue ice and elevate as well as compression to help with any postoperative edema.  Return in about 4 weeks (around 04/14/2021).  Trula Slade DPM

## 2021-04-21 ENCOUNTER — Ambulatory Visit (INDEPENDENT_AMBULATORY_CARE_PROVIDER_SITE_OTHER): Payer: 59

## 2021-04-21 ENCOUNTER — Other Ambulatory Visit: Payer: Self-pay

## 2021-04-21 ENCOUNTER — Ambulatory Visit (INDEPENDENT_AMBULATORY_CARE_PROVIDER_SITE_OTHER): Payer: 59 | Admitting: Podiatry

## 2021-04-21 DIAGNOSIS — M960 Pseudarthrosis after fusion or arthrodesis: Secondary | ICD-10-CM

## 2021-04-21 DIAGNOSIS — M7752 Other enthesopathy of left foot: Secondary | ICD-10-CM

## 2021-04-21 DIAGNOSIS — M205X1 Other deformities of toe(s) (acquired), right foot: Secondary | ICD-10-CM | POA: Diagnosis not present

## 2021-04-21 DIAGNOSIS — M7751 Other enthesopathy of right foot: Secondary | ICD-10-CM

## 2021-04-21 DIAGNOSIS — Z9889 Other specified postprocedural states: Secondary | ICD-10-CM

## 2021-04-21 DIAGNOSIS — M2061 Acquired deformities of toe(s), unspecified, right foot: Secondary | ICD-10-CM

## 2021-04-21 NOTE — Progress Notes (Signed)
SITUATION Reason for Consult: Evaluation for Bilateral Custom Foot Orthoses Patient / Caregiver Report: Patient is ready for foot orthotics  OBJECTIVE DATA: Patient History / Diagnosis:    ICD-10-CM   1. Status post right foot surgery  Z98.890     2. Hallux limitus of right foot  M20.5X1     3. Capsulitis of toe, right  M77.51     4. Acquired deformity of right toe  M20.61       Current or Previous Devices: Historical FO user  Foot Examination: Skin presentation:   Intact Ulcers & Call[place]:   None and no history Toe / Foot Deformities:  Pes planus Weight Bearing Presentation:  Planus Sensation:    Intact  Shoe Size: 10.74M  ORTHOTIC RECOMMENDATION Recommended Device: 1x pair of custom functional foot orthotics  GOALS OF ORTHOSES - Reduce Pain - Prevent Foot Deformity - Prevent Progression of Further Foot Deformity - Relieve Pressure - Improve the Overall Biomechanical Function of the Foot and Lower Extremity.  ACTIONS PERFORMED Patient was casted for Foot Orthoses via crush box. Procedure was explained and patient tolerated procedure well. All questions were answered and concerns addressed.  PLAN Potential out of pocket cost was communicated to patient. Casts are to be sent to [facility] for fabrication. Patient is to be called for fitting when devices are ready.

## 2021-04-23 NOTE — Progress Notes (Signed)
Subjective: Johnny Tran is a 46 y.o. is seen today in office s/p right foot first MPJ arthrodesis preformed on 01/15/2021.  He is about to wear shoe full-time and not having significant discomfort.  Asking for new orthotics today.  No recent injury or changes.  No concerns otherwise.  No fevers or chills.    Objective: General: No acute distress, AAOx3  DP/PT pulses palpable 2/4, CRT < 3 sec to all digits.  Protective sensation intact. Motor function intact.  Right foot: Incision is well coapted without any evidence of dehiscence and scar is formed.  No significant edema present.  There is no tenderness palpation at surgical site.  Arthrodesis site is stable. No other open lesions or pre-ulcerative lesions.  No pain with calf compression, swelling, warmth, erythema.   Assessment and Plan:  Status post right first MPJ arthrodesis-nonunion  -Treatment options discussed including all alternatives, risks, and complications -X-rays obtained reviewed.  Hardware intact.  The lucency still noted along the arthrodesis site consistent with a nonunion.  No evidence of acute fracture. -Given the nonunion arthrodesis site of the fracture A less than 1 cm 1 order bone stimulator. -Continue with supportive shoe gear.  Measured for new orthotics today with our orthotist, Aaron Edelman Little.   No follow-ups on file.  Trula Slade DPM

## 2021-05-13 ENCOUNTER — Telehealth: Payer: Self-pay

## 2021-05-13 NOTE — Telephone Encounter (Signed)
Lvm to schedule orthotics pick up

## 2021-05-19 ENCOUNTER — Other Ambulatory Visit: Payer: Self-pay

## 2021-05-19 ENCOUNTER — Ambulatory Visit: Payer: 59

## 2021-05-19 DIAGNOSIS — Z9889 Other specified postprocedural states: Secondary | ICD-10-CM

## 2021-05-19 DIAGNOSIS — M960 Pseudarthrosis after fusion or arthrodesis: Secondary | ICD-10-CM

## 2021-05-19 DIAGNOSIS — M7751 Other enthesopathy of right foot: Secondary | ICD-10-CM

## 2021-05-19 DIAGNOSIS — M205X1 Other deformities of toe(s) (acquired), right foot: Secondary | ICD-10-CM

## 2021-05-19 NOTE — Progress Notes (Signed)
SITUATION: ?Reason for Visit: Fitting and Delivery of Custom Fabricated Foot Orthoses ?Patient Report: Patient reports comfort and is satisfied with device. ? ?OBJECTIVE DATA: ?Patient History / Diagnosis:   ?  ICD-10-CM   ?1. Status post right foot surgery  Z98.890   ?  ?2. Hallux limitus of right foot  M20.5X1   ?  ?3. Capsulitis of toe, right  M77.51   ?  ?4. Nonunion after arthrodesis  M96.0   ?  ? ? ?Provided Device:  Custom Functional Foot Orthotics ?    [place]: [id number] ? ? ?GOAL OF ORTHOSIS ?- Improve gait ?- Decrease energy expenditure ?- Improve Balance ?- Provide Triplanar stability of foot complex ?- Facilitate motion ? ?ACTIONS PERFORMED ?Patient was fit with foot orthotics trimmed to shoe last. Patient tolerated fittign procedure.  ? ?Patient was provided with verbal and written instruction and demonstration regarding donning, doffing, wear, care, proper fit, function, purpose, cleaning, and use of the orthosis and in all related precautions and risks and benefits regarding the orthosis. ? ?Patient was also provided with verbal instruction regarding how to report any failures or malfunctions of the orthosis and necessary follow up care. Patient was also instructed to contact our office regarding any change in status that may affect the function of the orthosis. ? ?Patient demonstrated independence with proper donning, doffing, and fit and verbalized understanding of all instructions. ? ?PLAN: ?Patient is to follow up in one week or as necessary (PRN). All questions were answered and concerns addressed. Plan of care was discussed with and agreed upon by the patient. ? ?

## 2021-06-04 ENCOUNTER — Encounter: Payer: Self-pay | Admitting: Podiatry

## 2021-06-05 NOTE — Telephone Encounter (Signed)
Please call.

## 2021-06-09 ENCOUNTER — Ambulatory Visit: Payer: 59

## 2021-06-09 DIAGNOSIS — M2061 Acquired deformities of toe(s), unspecified, right foot: Secondary | ICD-10-CM

## 2021-06-09 DIAGNOSIS — M205X1 Other deformities of toe(s) (acquired), right foot: Secondary | ICD-10-CM

## 2021-06-09 DIAGNOSIS — M7751 Other enthesopathy of right foot: Secondary | ICD-10-CM

## 2021-06-09 DIAGNOSIS — M960 Pseudarthrosis after fusion or arthrodesis: Secondary | ICD-10-CM

## 2021-06-09 DIAGNOSIS — Z9889 Other specified postprocedural states: Secondary | ICD-10-CM

## 2021-06-09 NOTE — Progress Notes (Signed)
SITUATION ?Reason for Consult: Follow-up with custom foot orthotics ?Patient / Caregiver Report: Patient feels irritation on right hallux due to limited flexibility of insole ? ? ?OBJECTIVE DATA ?History / Diagnosis:  ?  ICD-10-CM   ?1. Status post right foot surgery  Z98.890   ?  ?2. Hallux limitus of right foot  M20.5X1   ?  ?3. Capsulitis of toe, right  M77.51   ?  ?4. Nonunion after arthrodesis  M96.0   ?  ?5. Acquired deformity of right toe  M20.61   ?  ?6. Hallux limitus, right  M20.5X1   ?  ? ? ?Change in Pathology: None ? ?ACTIONS PERFORMED ?Patient's equipment was checked for structural stability and fit. Thinned morton's extension for increase in flexibility. Device(s) intact and fit is excellent. All questions answered and concerns addressed. ? ?PLAN ?Follow-up as needed (PRN). Plan of care discussed with and agreed upon by patient / caregiver. ? ?

## 2021-06-16 ENCOUNTER — Ambulatory Visit (INDEPENDENT_AMBULATORY_CARE_PROVIDER_SITE_OTHER): Payer: 59 | Admitting: Podiatry

## 2021-06-16 ENCOUNTER — Ambulatory Visit (INDEPENDENT_AMBULATORY_CARE_PROVIDER_SITE_OTHER): Payer: 59

## 2021-06-16 DIAGNOSIS — Z9889 Other specified postprocedural states: Secondary | ICD-10-CM

## 2021-06-16 DIAGNOSIS — M205X1 Other deformities of toe(s) (acquired), right foot: Secondary | ICD-10-CM

## 2021-06-17 DIAGNOSIS — M205X1 Other deformities of toe(s) (acquired), right foot: Secondary | ICD-10-CM | POA: Insufficient documentation

## 2021-06-17 NOTE — Progress Notes (Signed)
Subjective: ?Johnny Tran is a 46 y.o. is seen today in office s/p right foot first MPJ arthrodesis preformed on 01/15/2021.  States that time she still feels as though following flip-flop in between his toes but not on a regular basis.  Denies any recent injury or changes.  Better wearing regular shoes.  He had the orthotics modified.  No other concerns today.  No fevers or chills.  ? ?Objective: ?General: No acute distress, AAOx3  ?DP/PT pulses palpable 2/4, CRT < 3 sec to all digits.  ?Protective sensation intact. Motor function intact.  ?Right foot: Incision is well coapted without any evidence of dehiscence and scar is formed.  There is no tenderness palpation at surgical site, arthrodesis site.  Arthrodesis site is stable.  There is still residual edema present on the proximal phalanx base that this is what contributed to the symptoms.  There is no erythema or warmth or any signs of infection.  Toes are rectus position.  ?No pain with calf compression, swelling, warmth, erythema.  ? ?Assessment and Plan:  ?Status post right first MPJ arthrodesis-nonunion ? ?-Treatment options discussed including all alternatives, risks, and complications ?-X-rays obtained reviewed.  Hardware intact.  The lucency still noted along the arthrodesis site consistent with a nonunion.  No evidence of acute fracture. ?-Did not get the bone stimulator due to cost, insurance did not cover this.  Clinically, nonunion standpoint.  There is no high significant pain but there is still some residual swelling.  We will continue to monitor this.  Continue supportive shoe gear, inserts. ? ?He is likely to be moving to [place] he did not contact me prior to his move for follow-up. ? ?Trula Slade DPM ?

## 2021-08-18 ENCOUNTER — Ambulatory Visit: Payer: 59 | Admitting: Podiatry

## 2021-08-18 ENCOUNTER — Ambulatory Visit (INDEPENDENT_AMBULATORY_CARE_PROVIDER_SITE_OTHER): Payer: 59

## 2021-08-18 DIAGNOSIS — Z9889 Other specified postprocedural states: Secondary | ICD-10-CM

## 2021-08-18 DIAGNOSIS — M205X1 Other deformities of toe(s) (acquired), right foot: Secondary | ICD-10-CM

## 2021-08-25 NOTE — Progress Notes (Signed)
Subjective: Johnny Tran is a 46 y.o. is seen today in office s/p right foot first MPJ arthrodesis preformed on 01/15/2021.  States he is doing well.  He states at times he gets what feels like "growing pains".  He is wearing regular shoes without significant pain. No recent injury or changes.   Objective: General: No acute distress, AAOx3  DP/PT pulses palpable 2/4, CRT < 3 sec to all digits.  Protective sensation intact. Motor function intact.  Right foot: Incision is well coapted without any evidence of dehiscence and scar is formed. There is no pain on exam. The arthrodesis site is stable. No significant edema.  No pain with calf compression, swelling, warmth, erythema.   Assessment and Plan:  Status post right first MPJ arthrodesis-nonunion  -Treatment options discussed including all alternatives, risks, and complications -X-rays obtained reviewed.  Hardware intact.  The lucency still noted along the arthrodesis site consistent with a nonunion. There does appear to be increased consolidation across the arthrodesis site.  No evidence of acute fracture.  -Continue supportive shoe gear, inserts.  Return if symptoms worsen or fail to improve.  Trula Slade DPM

## 2021-10-27 ENCOUNTER — Other Ambulatory Visit: Payer: Self-pay | Admitting: Internal Medicine

## 2021-10-27 DIAGNOSIS — R911 Solitary pulmonary nodule: Secondary | ICD-10-CM

## 2021-11-03 ENCOUNTER — Other Ambulatory Visit: Payer: Self-pay | Admitting: Internal Medicine

## 2021-11-03 ENCOUNTER — Ambulatory Visit
Admission: RE | Admit: 2021-11-03 | Discharge: 2021-11-03 | Disposition: A | Discharge status: Home / Self Care | Payer: 59 | Source: Ambulatory Visit | Attending: Internal Medicine | Admitting: Internal Medicine

## 2021-11-03 DIAGNOSIS — J22 Unspecified acute lower respiratory infection: Secondary | ICD-10-CM

## 2021-11-24 ENCOUNTER — Ambulatory Visit
Admission: RE | Admit: 2021-11-24 | Discharge: 2021-11-24 | Disposition: A | Discharge status: Home / Self Care | Payer: 59 | Source: Ambulatory Visit | Attending: Internal Medicine | Admitting: Internal Medicine

## 2021-11-24 DIAGNOSIS — R911 Solitary pulmonary nodule: Secondary | ICD-10-CM

## 2021-12-08 ENCOUNTER — Other Ambulatory Visit: Payer: Self-pay | Admitting: Internal Medicine

## 2021-12-08 ENCOUNTER — Ambulatory Visit
Admission: RE | Admit: 2021-12-08 | Discharge: 2021-12-08 | Disposition: A | Discharge status: Home / Self Care | Payer: 59 | Source: Ambulatory Visit | Attending: Internal Medicine | Admitting: Internal Medicine

## 2021-12-08 DIAGNOSIS — R052 Subacute cough: Secondary | ICD-10-CM

## 2022-02-04 ENCOUNTER — Ambulatory Visit: Payer: 59 | Admitting: Pulmonary Disease

## 2022-02-04 ENCOUNTER — Encounter: Payer: Self-pay | Admitting: Pulmonary Disease

## 2022-02-04 VITALS — BP 122/82 | HR 85 | Temp 98.9°F | Ht 69.0 in | Wt 201.0 lb

## 2022-02-04 DIAGNOSIS — R053 Chronic cough: Secondary | ICD-10-CM

## 2022-02-04 DIAGNOSIS — R911 Solitary pulmonary nodule: Secondary | ICD-10-CM | POA: Diagnosis not present

## 2022-02-04 DIAGNOSIS — R1319 Other dysphagia: Secondary | ICD-10-CM | POA: Diagnosis not present

## 2022-02-04 DIAGNOSIS — Z9189 Other specified personal risk factors, not elsewhere classified: Secondary | ICD-10-CM | POA: Diagnosis not present

## 2022-02-04 NOTE — Progress Notes (Signed)
Synopsis: Referred in November 2023 for cough and pulmonary nodule  Subjective:   PATIENT ID: Johnny Tran GENDER: male DOB: [birth year], MRN: [id number]   HPI  Chief Complaint  Patient presents with   Follow-up    Ct review 9/18 , Cxr 12/08/21, consistent cough.    Johnny Tran is here to see me because of his history of smoking and an abnormal CT scan of the chest that showed a pulmonary nodule.   He says that he had a cough not too long ago with mucus production, this has improved but he still has some mucus production.  He continues to struggle with sinus congestion.  He says that the mucus he's bringning up is milk, thick.  He can feel mucus rattling around in his chest when he takes a deep breath.   The time that he had the 11/2021 CT scan: he developed the cough about one week prior.  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 No choking on food at the onset of the illness, no known sick contact.  He recalls being in his attic a few days prior to getting sick, he was stirring up thick insulation.   He says that he will occasionally have food go down the wrong pipe.  He does take sleep aids.  He drinks alcohol every night.  He says that he rarely drinks to the point where he is drunk but he has been using more alcohol in the last year or so because of the divorce that he is going through right now and related stress.  History reviewed. No pertinent past medical history.   History reviewed. No pertinent family history.   Social History   Socioeconomic History   Marital status: Unknown    Spouse name: Not on file   Number of children: Not on file   Years of education: Not on file   Highest education level: Not on file  Occupational History   Not on file  Tobacco Use   Smoking status: Former    Types: Cigarettes    Quit date: 2015    Years since quitting: 8.9   Smokeless tobacco: Never  Vaping Use   Vaping Use: Never used  Substance and Sexual Activity   Alcohol use: Yes   Drug use: Yes     Types: Marijuana   Sexual activity: Not on file  Other Topics Concern   Not on file  Social History Narrative   Not on file   Social Determinants of Health   Financial Resource Strain: Not on file  Food Insecurity: Not on file  Transportation Needs: Not on file  Physical Activity: Not on file  Stress: Not on file  Social Connections: Not on file  Intimate Partner Violence: Not on file     Allergies  Allergen Reactions   Codeine Nausea And Vomiting    Other reaction(s): Sweating (intolerance)     Outpatient Medications Prior to Visit  Medication Sig Dispense Refill   amoxicillin-clavulanate (AUGMENTIN) 875-125 MG tablet Take 1 tablet by mouth 2 (two) times daily.     ALPRAZolam (XANAX) 0.5 MG tablet alprazolam 0.5 mg tablet     amphetamine-dextroamphetamine (ADDERALL) 20 MG tablet Take 20 mg by mouth every morning.     diazepam (VALIUM) 10 MG tablet Take 20 mg by mouth daily.     diphenhydrAMINE (SOMINEX) 25 MG tablet Take by mouth.     escitalopram (LEXAPRO) 20 MG tablet Take 20 mg by mouth daily.  3   fluticasone (  FLONASE) 50 MCG/ACT nasal spray Place 2 sprays into both nostrils daily as needed for allergies or rhinitis. 16 g 5   Ibuprofen-Famotidine (DUEXIS) 800-26.6 MG TABS Duexis 800 mg-26.6 mg tablet  Take 1 tablet 3 times a day by oral route.     lamoTRIgine (LAMICTAL) 100 MG tablet TAKE 1 TABLET BY MOUTH EVERYDAY AT BEDTIME     lamoTRIgine (LAMICTAL) 100 MG tablet 2 tablets     loperamide (IMODIUM) 2 MG capsule 2 capsules     Loperamide-Simethicone 2-125 MG TABS Take 1 tablet by mouth.     meloxicam (MOBIC) 15 MG tablet TAKE 1 TABLET (15 MG TOTAL) BY MOUTH DAILY. 30 tablet 0   Multiple Vitamins-Minerals (CENTRUM ADULTS) TABS See admin instructions.     Omega-3 Fatty Acids (FISH OIL) 1000 MG CAPS Take 1 capsule by mouth.     oxyCODONE-acetaminophen (PERCOCET) 10-325 MG tablet Take 1 tablet by mouth every 4 (four) hours as needed for pain. MAXIMUM TOTAL  ACETAMINOPHEN DOSE IS 4000 MG PER DAY 15 tablet 0   oxyCODONE-acetaminophen (PERCOCET/ROXICET) 5-325 MG tablet Take 1-2 tablets by mouth every 6 (six) hours as needed for severe pain. 20 tablet 0   Probiotic Product (ALIGN) 4 MG CAPS 1 capsule     promethazine (PHENERGAN) 25 MG tablet Take 1 tablet (25 mg total) by mouth every 8 (eight) hours as needed for nausea or vomiting. 20 tablet 0   sildenafil (REVATIO) 20 MG tablet sildenafil (pulmonary hypertension) 20 mg tablet     sildenafil (VIAGRA) 25 MG tablet      Tadalafil (CIALIS) 2.5 MG TABS 1 tablet     tadalafil (CIALIS) 20 MG tablet 1/4-1/2 BY MOUTH EVERY 36 HOURS AS NEEDED (TAKE AT LEAST 30MIN BEFORE INTERCOURSE)     traMADol (ULTRAM) 50 MG tablet Take 50-100 mg by mouth every 6 (six) hours.     traZODone (DESYREL) 50 MG tablet Take 25-50 mg by mouth at bedtime as needed.     Zinc 50 MG TABS 1 tablet     No facility-administered medications prior to visit.    Review of Systems  Constitutional:  Negative for chills, fever, malaise/fatigue and weight loss.  HENT:  Negative for congestion, nosebleeds, sinus pain and sore throat.   Eyes:  Negative for photophobia, pain and discharge.  Respiratory:  Positive for cough and sputum production. Negative for hemoptysis, shortness of breath and wheezing.   Cardiovascular:  Negative for chest pain, palpitations, orthopnea and leg swelling.  Gastrointestinal:  Negative for abdominal pain, constipation, diarrhea, nausea and vomiting.  Genitourinary:  Negative for dysuria, frequency, hematuria and urgency.  Musculoskeletal:  Negative for back pain, joint pain, myalgias and neck pain.  Skin:  Negative for itching and rash.  Neurological:  Negative for tingling, tremors, sensory change, speech change, focal weakness, seizures, weakness and headaches.  Psychiatric/Behavioral:  Negative for memory loss, substance abuse and suicidal ideas. The patient is not nervous/anxious.       Objective:   Physical Exam   Vitals:   02/04/22 1433  BP: 122/82  Pulse: 85  Temp: 98.9 F (37.2 C)  TempSrc: Oral  SpO2: 96%  Weight: 201 lb (91.2 kg)  Height: '5\' 9"'$  (1.753 m)    Gen: well appearing, no acute distress HENT: NCAT, OP clear, neck supple without masses Eyes: PERRL, EOMi PULM: CTA B CV: RRR, no mgr, no JVD GI: BS+, soft, nontender, no hsm Derm: no rash or skin breakdown MSK: normal bulk and tone Neuro: A&Ox4, CN II-XII  intact, strength 5/5 in all 4 extremities Psyche: normal mood and affect   CBC No results found for: "WBC", "RBC", "HGB", "HCT", "PLT", "MCV", "MCH", "MCHC", "RDW", "LYMPHSABS", "MONOABS", "EOSABS", "BASOSABS"   Chest imaging: 11/2021 CT chest images independently reviewed : Left lower lobe pulmonary nodule 6 mm in size, not significantly changed compared to September 2022 study, left lower lobe airway thickening, tree-in-bud abnormality most likely due to aspiration, patulous esophagus  PFT:  Labs:  Path:  Echo:  Heart Catheterization:       Assessment & Plan:   Chronic cough  Pulmonary nodule less than 1 cm in diameter with low risk for malignant neoplasm  Other dysphagia  Discussion: 46 year old male presents today for evaluation of chronic cough and pulmonary nodules.  He is a former smoker.  We went over images today in clinic at length comparing the left lower lobe incident nodule (6 mm) compared to the 2022 study.  It seems as if in 2022 the left lower lobe nodule was about 4 to 5 mm in size.  In 2023 had measured at 6 mm in size.  There is some margin and air with these measurements it is difficult to say that it has actually increased in size.  In addition to this there are tree-in-bud abnormalities airway thickening and mucous plugging in the left lower lobe which are highly suggestive of an aspiration event.  He has had ongoing cough and mucus production without other B symptoms.  I suspect that alcohol and some medical treatments  for anxiety and stress have contributed to low-level chronic aspiration.  Plan: Pulmonary nodule and former smoker: You need to have a repeat CT scan of your chest in September 2024  Tree-in-bud abnormalities in left lower lobe with airway thickening and ongoing bronchitis symptoms: As discussed today I worry that there is likely some degree of aspiration of pharyngeal contents causing this We will test the mucus for bacteria, fungal, AFB organisms to look for a chronic infection We will refer you to radiology for a modified barium swallow test to look for evidence of aspiration Minimize alcohol and sedative intake, particularly in the evenings prior to bed  Chronic cough: You need to try to suppress your cough to allow your larynx (voice box) to heal.  For three days don't talk, laugh, sing, or clear your throat. Do everything you can to suppress the cough during this time. Use hard candies (sugarless Jolly Ranchers) or non-mint or non-menthol containing cough drops during this time to soothe your throat.  Use a cough suppressant (Delsym or what I have prescribed you) around the clock during this time.  After three days, gradually increase the use of your voice and back off on the cough suppressants.  We will see you back if the above listed test are abnormal or if your symptoms don't improve over the next 4-6 weeks  Immunizations: Immunization History  Administered Date(s) Administered   Influenza-Unspecified 10/28/2020   PFIZER(Purple Top)SARS-COV-2 Vaccination 05/08/2019, 05/31/2019, 12/12/2019   Tdap 07/14/2016     Current Outpatient Medications:    amoxicillin-clavulanate (AUGMENTIN) 875-125 MG tablet, Take 1 tablet by mouth 2 (two) times daily., Disp: , Rfl:    ALPRAZolam (XANAX) 0.5 MG tablet, alprazolam 0.5 mg tablet, Disp: , Rfl:    amphetamine-dextroamphetamine (ADDERALL) 20 MG tablet, Take 20 mg by mouth every morning., Disp: , Rfl:    diazepam (VALIUM) 10 MG tablet, Take 20  mg by mouth daily., Disp: , Rfl:    diphenhydrAMINE ([place])  25 MG tablet, Take by mouth., Disp: , Rfl:    escitalopram (LEXAPRO) 20 MG tablet, Take 20 mg by mouth daily., Disp: , Rfl: 3   fluticasone (FLONASE) 50 MCG/ACT nasal spray, Place 2 sprays into both nostrils daily as needed for allergies or rhinitis., Disp: 16 g, Rfl: 5   Ibuprofen-Famotidine (DUEXIS) 800-26.6 MG TABS, Duexis 800 mg-26.6 mg tablet  Take 1 tablet 3 times a day by oral route., Disp: , Rfl:    lamoTRIgine (LAMICTAL) 100 MG tablet, TAKE 1 TABLET BY MOUTH EVERYDAY AT BEDTIME, Disp: , Rfl:    lamoTRIgine (LAMICTAL) 100 MG tablet, 2 tablets, Disp: , Rfl:    loperamide (IMODIUM) 2 MG capsule, 2 capsules, Disp: , Rfl:    Loperamide-Simethicone 2-125 MG TABS, Take 1 tablet by mouth., Disp: , Rfl:    meloxicam (MOBIC) 15 MG tablet, TAKE 1 TABLET (15 MG TOTAL) BY MOUTH DAILY., Disp: 30 tablet, Rfl: 0   Multiple Vitamins-Minerals (CENTRUM ADULTS) TABS, See admin instructions., Disp: , Rfl:    Omega-3 Fatty Acids (FISH OIL) 1000 MG CAPS, Take 1 capsule by mouth., Disp: , Rfl:    oxyCODONE-acetaminophen (PERCOCET) 10-325 MG tablet, Take 1 tablet by mouth every 4 (four) hours as needed for pain. MAXIMUM TOTAL ACETAMINOPHEN DOSE IS 4000 MG PER DAY, Disp: 15 tablet, Rfl: 0   oxyCODONE-acetaminophen (PERCOCET/ROXICET) 5-325 MG tablet, Take 1-2 tablets by mouth every 6 (six) hours as needed for severe pain., Disp: 20 tablet, Rfl: 0   Probiotic Product (ALIGN) 4 MG CAPS, 1 capsule, Disp: , Rfl:    promethazine (PHENERGAN) 25 MG tablet, Take 1 tablet (25 mg total) by mouth every 8 (eight) hours as needed for nausea or vomiting., Disp: 20 tablet, Rfl: 0   sildenafil (REVATIO) 20 MG tablet, sildenafil (pulmonary hypertension) 20 mg tablet, Disp: , Rfl:    sildenafil (VIAGRA) 25 MG tablet, , Disp: , Rfl:    Tadalafil (CIALIS) 2.5 MG TABS, 1 tablet, Disp: , Rfl:    tadalafil (CIALIS) 20 MG tablet, 1/4-1/2 BY MOUTH EVERY 36 HOURS AS NEEDED (TAKE  AT LEAST 30MIN BEFORE INTERCOURSE), Disp: , Rfl:    traMADol (ULTRAM) 50 MG tablet, Take 50-100 mg by mouth every 6 (six) hours., Disp: , Rfl:    traZODone (DESYREL) 50 MG tablet, Take 25-50 mg by mouth at bedtime as needed., Disp: , Rfl:    Zinc 50 MG TABS, 1 tablet, Disp: , Rfl:

## 2022-02-04 NOTE — Patient Instructions (Signed)
Pulmonary nodule and former smoker: You need to have a repeat CT scan of your chest in September 2024  Tree-in-bud abnormalities in left lower lobe with airway thickening and ongoing bronchitis symptoms: As discussed today I worry that there is likely some degree of aspiration of pharyngeal contents causing this We will test the mucus for bacteria, fungal, AFB organisms to look for a chronic infection We will refer you to radiology for a modified barium swallow test to look for evidence of aspiration Minimize alcohol and sedative intake, particularly in the evenings prior to bed  Chronic cough: You need to try to suppress your cough to allow your larynx (voice box) to heal.  For three days don't talk, laugh, sing, or clear your throat. Do everything you can to suppress the cough during this time. Use hard candies (sugarless Jolly Ranchers) or non-mint or non-menthol containing cough drops during this time to soothe your throat.  Use a cough suppressant (Delsym or what I have prescribed you) around the clock during this time.  After three days, gradually increase the use of your voice and back off on the cough suppressants.  We will see you back if the above listed test are abnormal or if your symptoms don't improve over the next 4-6 weeks

## 2022-02-04 NOTE — Addendum Note (Signed)
Addended by: Loma Sousa on: 02/04/2022 04:45 PM   Modules accepted: Orders

## 2022-02-05 ENCOUNTER — Other Ambulatory Visit: Payer: 59

## 2022-02-05 ENCOUNTER — Other Ambulatory Visit (HOSPITAL_COMMUNITY): Payer: Self-pay | Admitting: *Deleted

## 2022-02-05 DIAGNOSIS — R1310 Dysphagia, unspecified: Secondary | ICD-10-CM

## 2022-02-05 DIAGNOSIS — R053 Chronic cough: Secondary | ICD-10-CM

## 2022-02-09 ENCOUNTER — Encounter: Payer: Self-pay | Admitting: Pulmonary Disease

## 2022-02-23 ENCOUNTER — Ambulatory Visit (HOSPITAL_COMMUNITY)
Admission: RE | Admit: 2022-02-23 | Discharge: 2022-02-23 | Disposition: A | Discharge status: Home / Self Care | Payer: 59 | Source: Ambulatory Visit | Attending: Pulmonary Disease | Admitting: Pulmonary Disease

## 2022-02-23 DIAGNOSIS — R1310 Dysphagia, unspecified: Secondary | ICD-10-CM | POA: Insufficient documentation

## 2022-02-23 DIAGNOSIS — R1319 Other dysphagia: Secondary | ICD-10-CM

## 2022-03-03 IMAGING — CT CT CHEST W/O CM
1 series · 15 of 34 positions shown, 19 images · non-contrast
Comparison: None.

CLINICAL DATA: Asymptomatic former smoker. Quit in 6226. Screening
for lung cancer.

EXAM:
CT CHEST WITHOUT CONTRAST
TECHNIQUE: Multidetector CT imaging of the chest was performed following the
standard protocol without IV contrast.

[Series 2: chest w/(date) · axial · 0.80mm/px · z∈[-350,-16]mm · 15 of 197 slices shown, 19 images]
[im 15/197  mediastinal]
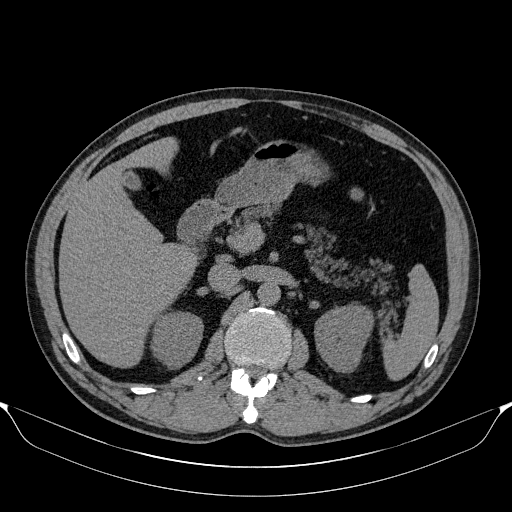
[im 15/197  lung]
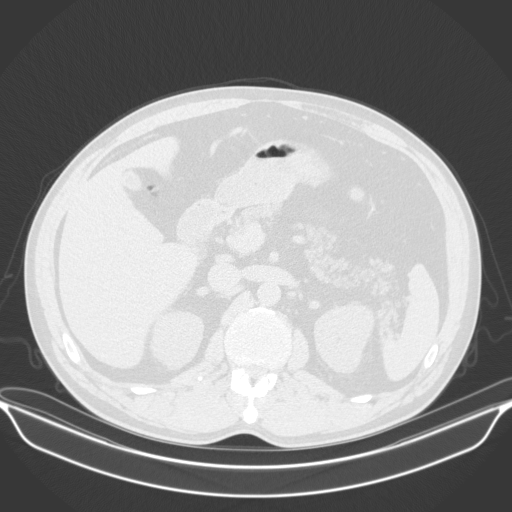
[im 30/197  lung]
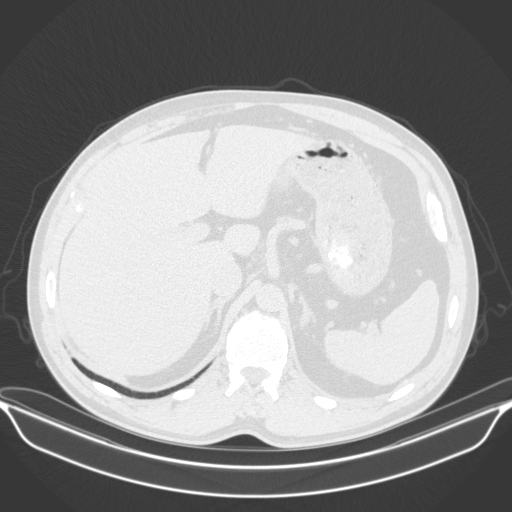
[im 40/197  lung]
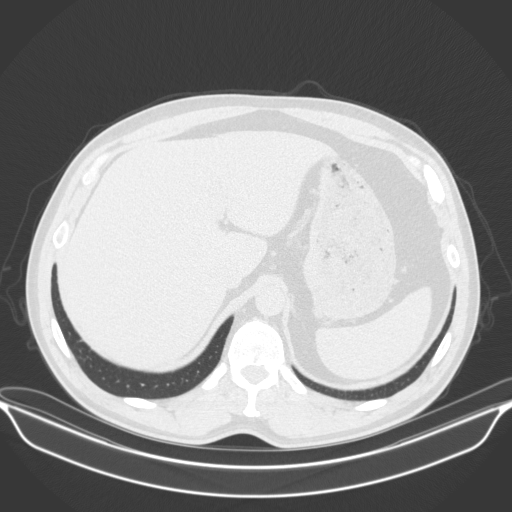
[im 51/197  lung]
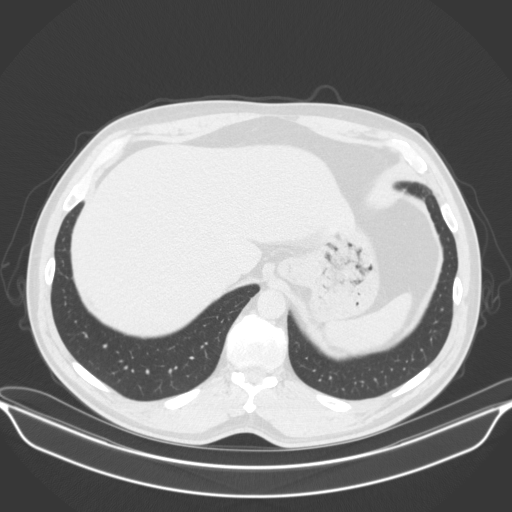
[im 66/197  mediastinal]
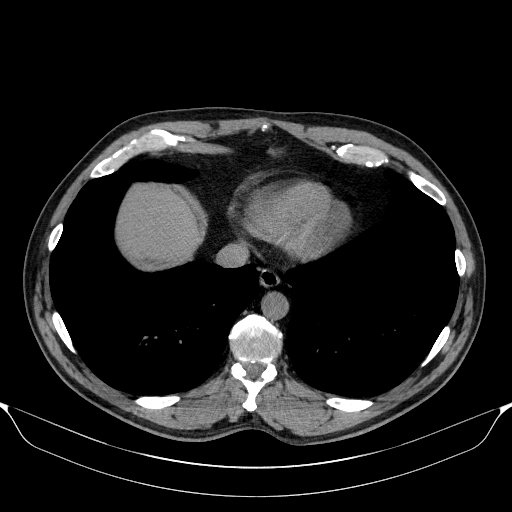
[im 66/197  lung]
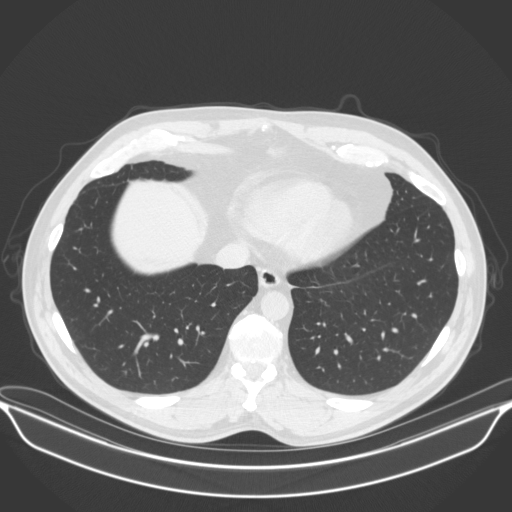
[im 79/197  lung]
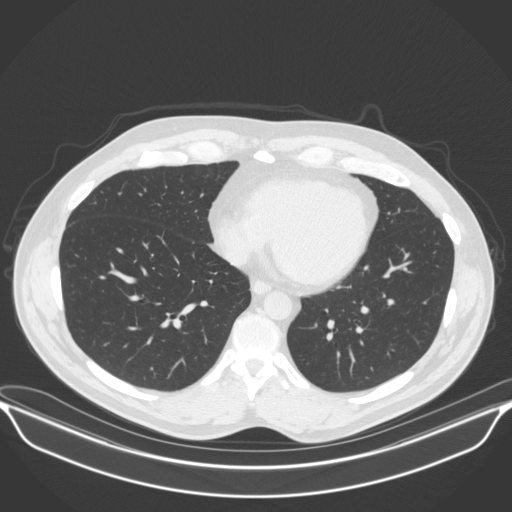
[im 88/197  lung]
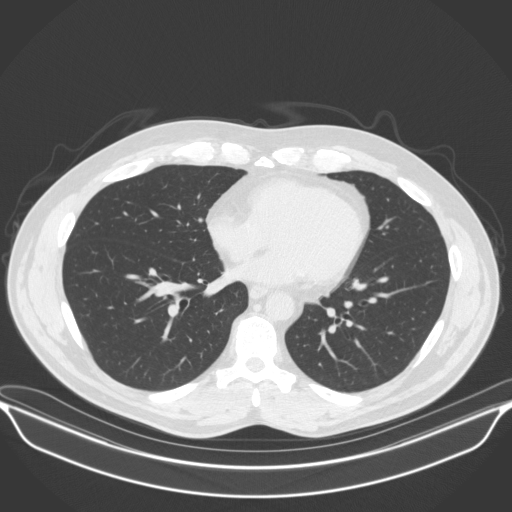
[im 102/197  lung]
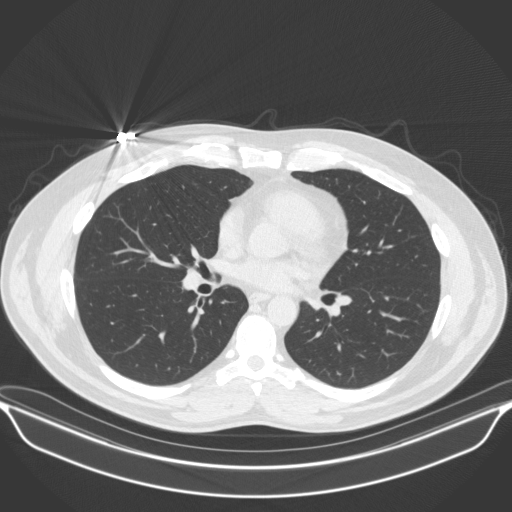
[im 109/197  mediastinal]
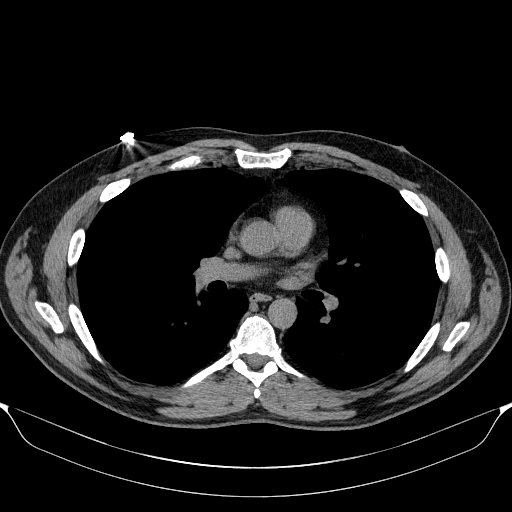
[im 109/197  lung]
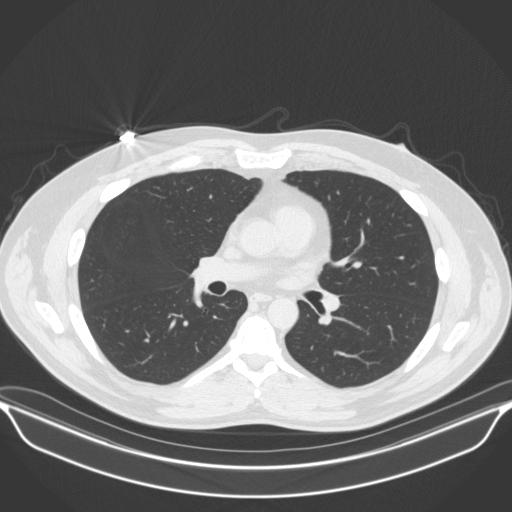
[im 118/197  lung]
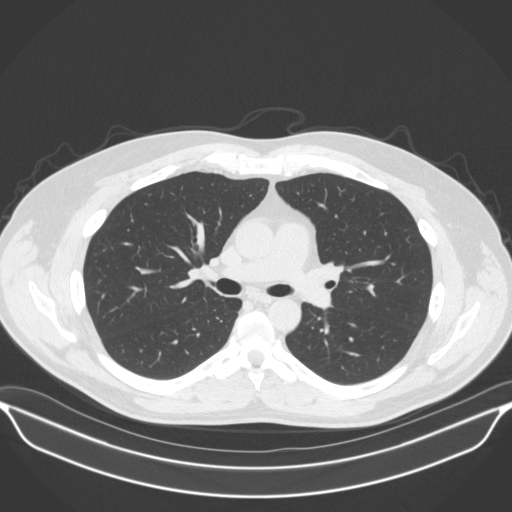
[im 131/197  lung]
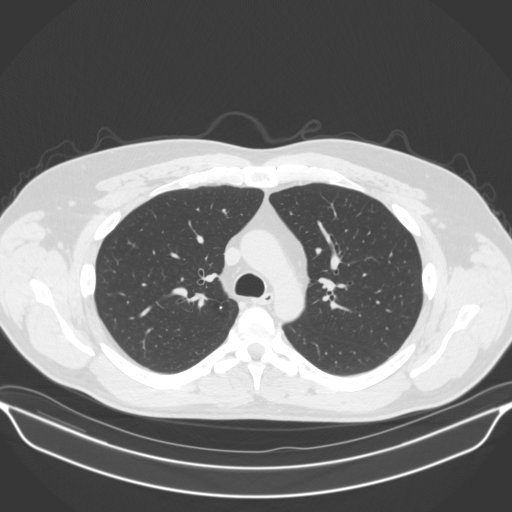
[im 146/197  lung]
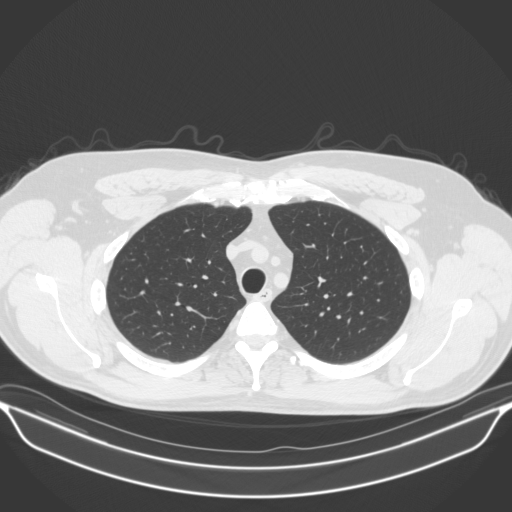
[im 157/197  mediastinal]
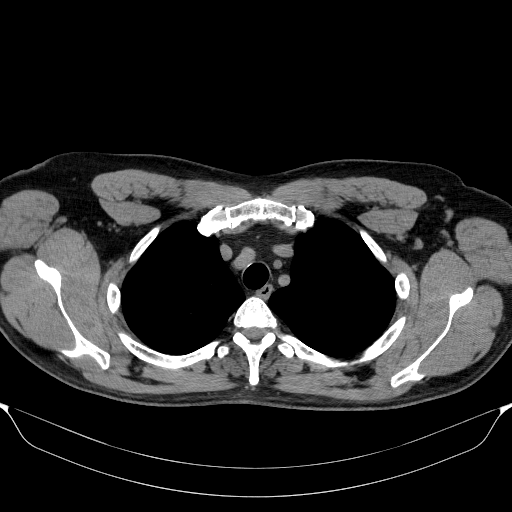
[im 157/197  lung]
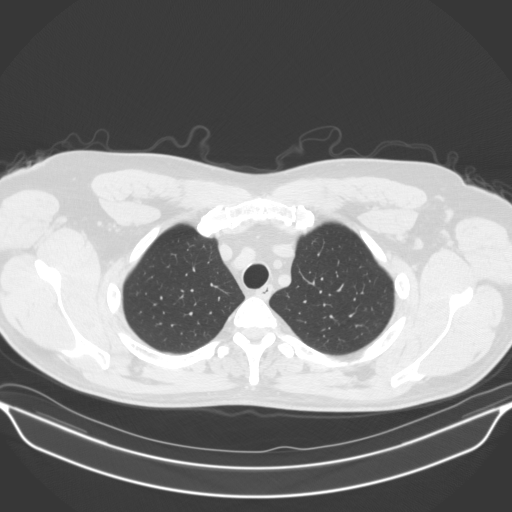
[im 167/197  lung]
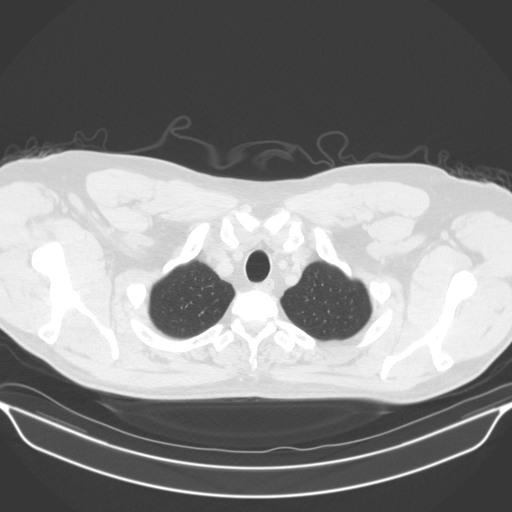
[im 182/197  lung]
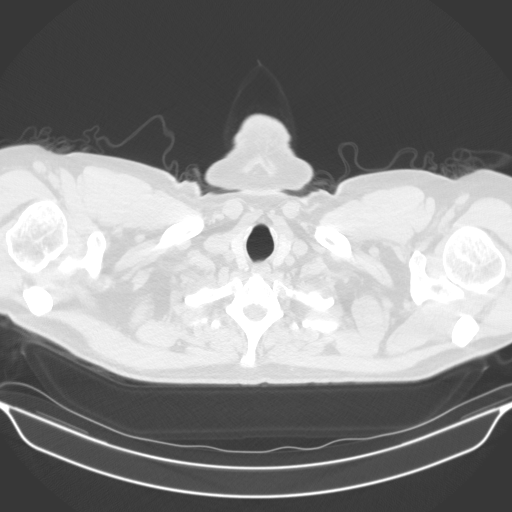

[15 of 34 positions shown; findings below may reference images not displayed]

FINDINGS: Cardiovascular: Normal heart size. No pericardial effusion. Some
coronary artery calcification is visible in the left system. Minimal
aortic atherosclerotic calcification is visible.

Mediastinum/Nodes: No sign of scratch that no mediastinal or hilar
lymphadenopathy.

Lungs/Pleura: Small scar at the left lung base adjacent to the
diaphragm. 4 mm subpleural nodule in the left lower lobe axial image
129. 2 mm subpleural nodule in the right lower lobe axial image 117.
no emphysematous change. No pleural effusion.

Upper Abdomen: Normal

Musculoskeletal: Normal
IMPRESSION: Some coronary artery calcification noted in the left system. Aortic
atherosclerotic calcification, minimal.

4 mm pulmonary nodule in the subpleural left lower lobe axial image
129. 2 mm subpleural nodule in the right lower lobe axial image 117.
No follow-up needed if patient is low-risk (and has no known or
suspected primary neoplasm). Non-contrast chest CT can be considered
in 12 months if patient is high-risk. This recommendation follows
the consensus statement: Guidelines for Management of Incidental
Pulmonary Nodules Detected on CT Images: From the [HOSPITAL]

## 2022-03-12 LAB — RESPIRATORY CULTURE OR RESPIRATORY AND SPUTUM CULTURE
MICRO NUMBER:: 14252053
RESULT:: NORMAL
SPECIMEN QUALITY:: ADEQUATE

## 2022-03-12 LAB — FUNGUS CULTURE W SMEAR
MICRO NUMBER:: 14252052
SMEAR:: NONE SEEN
SPECIMEN QUALITY:: ADEQUATE

## 2022-03-16 ENCOUNTER — Ambulatory Visit: Payer: 59 | Admitting: Pulmonary Disease

## 2022-03-16 ENCOUNTER — Encounter: Payer: Self-pay | Admitting: Pulmonary Disease

## 2022-03-16 VITALS — BP 118/76 | HR 85 | Ht 69.0 in | Wt 205.0 lb

## 2022-03-16 DIAGNOSIS — K219 Gastro-esophageal reflux disease without esophagitis: Secondary | ICD-10-CM | POA: Diagnosis not present

## 2022-03-16 DIAGNOSIS — R053 Chronic cough: Secondary | ICD-10-CM | POA: Diagnosis not present

## 2022-03-16 DIAGNOSIS — J410 Simple chronic bronchitis: Secondary | ICD-10-CM | POA: Diagnosis not present

## 2022-03-16 MED ORDER — BREZTRI AEROSPHERE 160-9-4.8 MCG/ACT IN AERO: 2.0000 | INHALATION_SPRAY | Freq: Two times a day (BID) | RESPIRATORY_TRACT | 0 refills | Status: AC

## 2022-03-16 MED ORDER — PANTOPRAZOLE SODIUM 40 MG PO TBEC: DELAYED_RELEASE_TABLET | ORAL | 2 refills | Status: DC

## 2022-03-16 NOTE — Patient Instructions (Signed)
Chronic cough with heartburn, chronic bronchitis features: PFT Protonix twice a day x 2 weeks then daily Trial of Breztri 2 puffs daily no matter how you feel (start 14 days from now) do not take if cough has resolved Follow-up in 4 to 6 weeks to go over the results of the lung function test and see how you are doing on Protonix  Gastroesophageal reflux disease: Protonix Avoid fatty foods, alcohol, chocolate, caffeine and tobacco products No eating within 3 hours of bedtime  Pulmonary nodule: Repeat CT chest September 2024  Follow-up 4 to 6 weeks

## 2022-03-16 NOTE — Progress Notes (Signed)
Synopsis: Referred in November 2023 for cough and pulmonary nodule SLP evaluation:   Subjective:   PATIENT ID: Johnny Tran GENDER: male DOB: 1975/11/05, MRN: [id number]   HPI  Chief Complaint  Patient presents with   Follow-up    F/U on cough. States he still has a productive cough with thick white, cloudy phlegm. The cough tends to increase with excessive talking and laughing.      Cough is not as frequent as what he had before Laughing will make him bring up mucus, he'll still bring up mucus from time to time He tried elevating the head of his bed with wood blocks he had a hard time He's using CPAP nightly, sleeps on his stomach  He has been having heart burn He is still producing thick yellow mucus  History reviewed. No pertinent past medical history.  Review of Systems  Constitutional:  Negative for chills, fever, malaise/fatigue and weight loss.  HENT:  Negative for congestion, sinus pain and sore throat.   Respiratory:  Positive for cough. Negative for sputum production and shortness of breath.   Cardiovascular:  Negative for chest pain and leg swelling.      Objective:  Physical Exam   Vitals:   03/16/22 0954  BP: 118/76  Pulse: 85  SpO2: 96%  Weight: 205 lb (93 kg)  Height: '5\' 9"'$  (1.753 m)    Gen: well appearing HENT: OP clear, neck supple PULM: CTA B, normal effort  CV: RRR, no mgr GI: BS+, soft, nontender Derm: no cyanosis or rash Psyche: normal mood and affect    CBC No results found for: "WBC", "RBC", "HGB", "HCT", "PLT", "MCV", "MCH", "MCHC", "RDW", "LYMPHSABS", "MONOABS", "EOSABS", "BASOSABS"   Chest imaging: 11/2021 CT chest images independently reviewed : Left lower lobe pulmonary nodule 6 mm in size, not significantly changed compared to September 2022 study, left lower lobe airway thickening, tree-in-bud abnormality most likely due to aspiration, patulous esophagus  PFT:  Labs: February 05, 2022 sputum AFB negative February 05, 2022 sputum fungus Candida species February 05, 2022 sputum culture oropharyngeal flora  Path:  Echo:  Heart Catheterization:       Assessment & Plan:   Simple chronic bronchitis (DISH) - Plan: Pulmonary function test  Chronic cough  Gastroesophageal reflux disease, unspecified whether esophagitis present  Discussion: Jashan's cough has improved somewhat since he is cut back on drinking but he still has cough with mucus production and a cough which is exacerbated by laughing.  I think he has both some degree of laryngeal irritation, likely underlying heartburn contributing to this.  However, he is at increased risk for chronic bronchitis given his smoking history and his ongoing sputum production.  Finally, it still not clear to me why has tree-in-bud abnormalities seen on the CT scan of his chest but fortunately there is no evidence of a chronic pulmonary infection based on culture results.  The Candida is a colonizer.  Plan: Chronic cough with heartburn, chronic bronchitis features: PFT Protonix twice a day x 2 weeks then daily Trial of Breztri 2 puffs daily no matter how you feel (start 14 days from now) do not take if cough has resolved Follow-up in 4 to 6 weeks to go over the results of the lung function test and see how you are doing on Protonix  Gastroesophageal reflux disease: Protonix Avoid fatty foods, alcohol, chocolate, caffeine and tobacco products No eating within 3 hours of bedtime  Pulmonary nodule: Repeat CT chest September 2024  Follow-up  4 to 6 weeks  Immunizations: Immunization History  Administered Date(s) Administered   Influenza-Unspecified 10/28/2020   PFIZER(Purple Top)SARS-COV-2 Vaccination 05/08/2019, 05/31/2019, 12/12/2019   Tdap 07/14/2016     Current Outpatient Medications:    amphetamine-dextroamphetamine (ADDERALL) 20 MG tablet, Take 20 mg by mouth every morning., Disp: , Rfl:    atorvastatin (LIPITOR) 10 MG tablet, Take 10 mg by mouth  daily., Disp: , Rfl:    Budeson-Glycopyrrol-Formoterol (BREZTRI AEROSPHERE) 160-9-4.8 MCG/ACT AERO, Inhale 2 puffs into the lungs in the morning and at bedtime., Disp: 2 each, Rfl: 0   fluticasone (FLONASE) 50 MCG/ACT nasal spray, Place 2 sprays into both nostrils daily as needed for allergies or rhinitis., Disp: 16 g, Rfl: 5   lamoTRIgine (LAMICTAL) 200 MG tablet, Take 200 mg by mouth daily., Disp: , Rfl:    loperamide (IMODIUM) 2 MG capsule, 2 capsules, Disp: , Rfl:    Loperamide-Simethicone 2-125 MG TABS, Take 1 tablet by mouth., Disp: , Rfl:    Multiple Vitamins-Minerals (CENTRUM ADULTS) TABS, See admin instructions., Disp: , Rfl:    Omega-3 Fatty Acids (FISH OIL) 1000 MG CAPS, Take 1 capsule by mouth., Disp: , Rfl:    pantoprazole (PROTONIX) 40 MG tablet, Take 1 tablet (40 mg total) by mouth 2 (two) times daily for 14 days, THEN 1 tablet (40 mg total) daily for 14 days., Disp: 42 tablet, Rfl: 2   Probiotic Product (ALIGN) 4 MG CAPS, 1 capsule, Disp: , Rfl:    sildenafil (REVATIO) 20 MG tablet, sildenafil (pulmonary hypertension) 20 mg tablet, Disp: , Rfl:    Tadalafil (CIALIS) 2.5 MG TABS, 1 tablet, Disp: , Rfl:    tadalafil (CIALIS) 20 MG tablet, 1/4-1/2 BY MOUTH EVERY 36 HOURS AS NEEDED (TAKE AT LEAST 30MIN BEFORE INTERCOURSE), Disp: , Rfl:    traZODone (DESYREL) 50 MG tablet, Take 25-50 mg by mouth at bedtime as needed., Disp: , Rfl:    Zinc 50 MG TABS, 1 tablet, Disp: , Rfl:

## 2022-04-10 ENCOUNTER — Other Ambulatory Visit: Payer: Self-pay | Admitting: Pulmonary Disease

## 2022-04-16 ENCOUNTER — Encounter: Payer: Self-pay | Admitting: Pulmonary Disease

## 2022-04-16 ENCOUNTER — Ambulatory Visit (INDEPENDENT_AMBULATORY_CARE_PROVIDER_SITE_OTHER): Payer: 59 | Admitting: Pulmonary Disease

## 2022-04-16 ENCOUNTER — Ambulatory Visit: Payer: 59 | Admitting: Pulmonary Disease

## 2022-04-16 VITALS — BP 140/84 | HR 82 | Temp 98.4°F | Ht 69.0 in | Wt 206.0 lb

## 2022-04-16 DIAGNOSIS — G4733 Obstructive sleep apnea (adult) (pediatric): Secondary | ICD-10-CM

## 2022-04-16 DIAGNOSIS — J410 Simple chronic bronchitis: Secondary | ICD-10-CM | POA: Diagnosis not present

## 2022-04-16 DIAGNOSIS — Z789 Other specified health status: Secondary | ICD-10-CM

## 2022-04-16 DIAGNOSIS — K219 Gastro-esophageal reflux disease without esophagitis: Secondary | ICD-10-CM

## 2022-04-16 DIAGNOSIS — R053 Chronic cough: Secondary | ICD-10-CM | POA: Diagnosis not present

## 2022-04-16 DIAGNOSIS — R911 Solitary pulmonary nodule: Secondary | ICD-10-CM

## 2022-04-16 LAB — PULMONARY FUNCTION TEST
DL/VA % pred: 98 %
DL/VA: 4.45 ml/min/mmHg/L
DLCO cor % pred: 109 %
DLCO cor: 31.42 ml/min/mmHg
DLCO unc % pred: 109 %
DLCO unc: 31.42 ml/min/mmHg
FEF 25-75 Post: 5.99 L/sec
FEF 25-75 Pre: 5.48 L/sec
FEF2575-%Change-Post: 9 %
FEF2575-%Pred-Post: 172 %
FEF2575-%Pred-Pre: 157 %
FEV1-%Change-Post: 1 %
FEV1-%Pred-Post: 119 %
FEV1-%Pred-Pre: 116 %
FEV1-Post: 4.6 L
FEV1-Pre: 4.52 L
FEV1FVC-%Change-Post: 4 %
FEV1FVC-%Pred-Pre: 109 %
FEV6-%Change-Post: -2 %
FEV6-%Pred-Post: 108 %
FEV6-%Pred-Pre: 110 %
FEV6-Post: 5.17 L
FEV6-Pre: 5.29 L
FEV6FVC-%Pred-Post: 103 %
FEV6FVC-%Pred-Pre: 103 %
FVC-%Change-Post: -2 %
FVC-%Pred-Post: 104 %
FVC-%Pred-Pre: 106 %
FVC-Post: 5.17 L
FVC-Pre: 5.29 L
Post FEV1/FVC ratio: 89 %
Post FEV6/FVC ratio: 100 %
Pre FEV1/FVC ratio: 85 %
Pre FEV6/FVC Ratio: 100 %
RV % pred: 72 %
RV: 1.41 L
TLC % pred: 101 %
TLC: 6.85 L

## 2022-04-16 NOTE — Progress Notes (Signed)
Full PFT Performed Today. 

## 2022-04-16 NOTE — Progress Notes (Signed)
Synopsis: Referred in November 2023 for cough and pulmonary nodule SLP evaluation: no aspiration Felt to have GERD and post nasal drip related cough  Subjective:   PATIENT ID: Johnny Tran GENDER: male DOB: 31-Jul-1975, MRN: [id number]   HPI  Chief Complaint  Patient presents with   Follow-up    Pft review    Still coughing, has a lot of mucus congestion in the morning Cough is worse in the morning, worse after drinking more Didn't take his inhaler yet because he isn't sure if it would work  No past medical history on file.    Review of Systems  Constitutional:  Negative for chills, fever, malaise/fatigue and weight loss.  HENT:  Negative for congestion, sinus pain and sore throat.   Respiratory:  Positive for cough. Negative for sputum production and shortness of breath.   Cardiovascular:  Negative for chest pain and leg swelling.      Objective:  Physical Exam   Vitals:   04/16/22 1001  BP: (!) 140/84  Pulse: 82  Temp: 98.4 F (36.9 C)  TempSrc: Oral  Weight: 206 lb (93.4 kg)  Height: '5\' 9"'$  (1.753 m)    Gen: well appearing HENT: OP clear, neck supple PULM: CTA B, normal effort  CV: RRR, no mgr GI: BS+, soft, nontender Derm: no cyanosis or rash Psyche: normal mood and affect     CBC No results found for: "WBC", "RBC", "HGB", "HCT", "PLT", "MCV", "MCH", "MCHC", "RDW", "LYMPHSABS", "MONOABS", "EOSABS", "BASOSABS"   Chest imaging: 11/2021 CT chest images independently reviewed : Left lower lobe pulmonary nodule 6 mm in size, not significantly changed compared to September 2022 study, left lower lobe airway thickening, tree-in-bud abnormality most likely due to aspiration, patulous esophagus  PFT: February 2020 for full PFT ratio 89%, FVC 5.17 L, total lung capacity 6.85 L 101% predicted, DLCO 31.4 to 109% predicted  Labs: February 05, 2022 sputum AFB negative February 05, 2022 sputum fungus Candida species February 05, 2022 sputum culture  oropharyngeal flora  Path:  Echo:  Heart Catheterization:       Assessment & Plan:   Alcohol use  OSA on CPAP  Chronic cough  Gastroesophageal reflux disease, unspecified whether esophagitis present  Pulmonary nodule  Discussion: Johnny Tran is still coughing predominantly in the mornings.  He says it is worse after alcohol consumption.  He has a lot of throat irritation during the daytime.  Lung function testing shows no evidence of underlying lung disease.  He has pulmonary nodules which need to be followed but there is no reason to consider that those are related to the cough.  His cough is all upper airway in nature.  We talked about how alcohol consumption in combination with the trazodone and lamotrigine which she takes likely make him have some degree of low-level aspiration in the evenings when he goes to bed.  This will lead to ongoing laryngeal irritation.  Further, talking during the daytime and clearing his throat frequently and the active coughing itself will cause ongoing irritation which perpetuate the cough.  I told him he needs to try to cut back on alcohol significantly to minimize any reflux and overnight aspiration.  Plan: Cough in setting of upper airway irritation: Minimize alcohol consumption Keep taking reflux therapy Try not to eat within 3 hours of bedtime Try to minimize clearing your throat during the daytime Continue to use warm beverages, hard candies to help soothe your throat  Pulmonary nodule: Repeat CT chest in September  Prior  smoker: As stated today your lung function testing is normal, further there is no evidence of significant emphysema or other changes on the CT scan of your chest  We will see you back in September, sooner if needed.  Immunizations: Immunization History  Administered Date(s) Administered   Influenza-Unspecified 10/28/2020   PFIZER(Purple Top)SARS-COV-2 Vaccination 05/08/2019, 05/31/2019, 12/12/2019   Tdap 07/14/2016      Current Outpatient Medications:    amphetamine-dextroamphetamine (ADDERALL) 20 MG tablet, Take 20 mg by mouth every morning., Disp: , Rfl:    atorvastatin (LIPITOR) 10 MG tablet, Take 10 mg by mouth daily., Disp: , Rfl:    Budeson-Glycopyrrol-Formoterol (BREZTRI AEROSPHERE) 160-9-4.8 MCG/ACT AERO, Inhale 2 puffs into the lungs in the morning and at bedtime., Disp: 2 each, Rfl: 0   fluticasone (FLONASE) 50 MCG/ACT nasal spray, Place 2 sprays into both nostrils daily as needed for allergies or rhinitis., Disp: 16 g, Rfl: 5   lamoTRIgine (LAMICTAL) 200 MG tablet, Take 200 mg by mouth daily., Disp: , Rfl:    loperamide (IMODIUM) 2 MG capsule, 2 capsules, Disp: , Rfl:    Loperamide-Simethicone 2-125 MG TABS, Take 1 tablet by mouth., Disp: , Rfl:    Multiple Vitamins-Minerals (CENTRUM ADULTS) TABS, See admin instructions., Disp: , Rfl:    Omega-3 Fatty Acids (FISH OIL) 1000 MG CAPS, Take 1 capsule by mouth., Disp: , Rfl:    pantoprazole (PROTONIX) 40 MG tablet, TAKE 1 TABLET BY MOUTH 2 TIMES DAILY FOR 14 DAYS, THEN 1 TABLET DAILY FOR 14 DAYS., Disp: 126 tablet, Rfl: 3   Probiotic Product (ALIGN) 4 MG CAPS, 1 capsule, Disp: , Rfl:    sildenafil (REVATIO) 20 MG tablet, sildenafil (pulmonary hypertension) 20 mg tablet, Disp: , Rfl:    Tadalafil (CIALIS) 2.5 MG TABS, 1 tablet, Disp: , Rfl:    tadalafil (CIALIS) 20 MG tablet, 1/4-1/2 BY MOUTH EVERY 36 HOURS AS NEEDED (TAKE AT LEAST 30MIN BEFORE INTERCOURSE), Disp: , Rfl:    traZODone (DESYREL) 50 MG tablet, Take 25-50 mg by mouth at bedtime as needed., Disp: , Rfl:    Zinc 50 MG TABS, 1 tablet, Disp: , Rfl:

## 2022-04-16 NOTE — Patient Instructions (Signed)
Cough in setting of upper airway irritation: Minimize alcohol consumption Keep taking reflux therapy Try not to eat within 3 hours of bedtime Try to minimize clearing your throat during the daytime Continue to use warm beverages, hard candies to help soothe your throat  Pulmonary nodule: Repeat CT chest in September  Prior smoker: As stated today your lung function testing is normal, further there is no evidence of significant emphysema or other changes on the CT scan of your chest  We will see you back in September, sooner if needed.

## 2022-04-16 NOTE — Patient Instructions (Signed)
Full PFT Performed Today. 

## 2022-05-02 LAB — AFB CULTURE WITH SMEAR (NOT AT ARMC)
Acid Fast Culture: NEGATIVE
Acid Fast Smear: NEGATIVE

## 2022-05-02 LAB — SPECIMEN STATUS REPORT

## 2022-05-06 ENCOUNTER — Encounter: Payer: Self-pay | Admitting: Pulmonary Disease

## 2022-05-07 NOTE — Telephone Encounter (Signed)
Dr. Lake Bells, please advise on pt's message regarding throat irritation. Thanks.

## 2022-09-22 ENCOUNTER — Other Ambulatory Visit: Payer: Self-pay | Admitting: Internal Medicine

## 2022-09-22 DIAGNOSIS — Q453 Other congenital malformations of pancreas and pancreatic duct: Secondary | ICD-10-CM

## 2022-10-12 ENCOUNTER — Ambulatory Visit
Admission: RE | Admit: 2022-10-12 | Discharge: 2022-10-12 | Disposition: A | Discharge status: Home / Self Care | Payer: 59 | Source: Ambulatory Visit | Attending: Internal Medicine | Admitting: Internal Medicine

## 2022-10-12 DIAGNOSIS — Q453 Other congenital malformations of pancreas and pancreatic duct: Secondary | ICD-10-CM

## 2022-10-12 MED ORDER — IOPAMIDOL (ISOVUE-300) INJECTION 61%
100.0000 mL | Freq: Once | INTRAVENOUS | Status: AC | PRN
  Administered 2022-10-12: 100 mL via INTRAVENOUS

## 2023-04-27 ENCOUNTER — Telehealth: Payer: Self-pay | Admitting: Podiatry

## 2023-04-27 NOTE — Telephone Encounter (Signed)
 Patient called stating he had surgery from Dr Ardelle Anton and has a plate in his foot. Patient is asking for some documentation for work stating that he had surgery and has a plate in his foot because work is giving you a hard time for his accommodations to be sitting.

## 2023-04-30 ENCOUNTER — Encounter: Payer: Self-pay | Admitting: Podiatry

## 2023-04-30 ENCOUNTER — Telehealth: Payer: Self-pay | Admitting: Podiatry

## 2023-04-30 NOTE — Telephone Encounter (Signed)
 Received a message from patient -- wants a letter for his employer stating he had surgery on his right foot in 2022.  He moved to [place] for his job in October 2024 and his new management does not know about his foot -- he just wants a letter acknowledging his situation.  Per Dr. Ardelle Anton, the letter is ok but if he was having issues he should come in.  The patient will be back in [place] in April  -- sched appt for him with Dr. Ardelle Anton for 06/14/2023 @ 8:45 AM ....   The patient does not need accommodations at work - just the letter to confirm his 2022 surgery .Marland Kitchen...    Patient is also looking into a podiatrist he can transfer to.       J. Abbott -- 05/04/2023

## 2023-06-14 ENCOUNTER — Ambulatory Visit: Payer: 59 | Admitting: Podiatry
